# Patient Record
Sex: Female | Born: 1975 | Race: White | Hispanic: Yes | Marital: Single | Smoking: Never smoker
Health system: Southern US, Community
[De-identification: ages and names within clinical notes are randomized; demographics above are authoritative.]

---

## 1997-12-24 HISTORY — PX: TUBAL LIGATION: SHX77

## 2018-07-16 ENCOUNTER — Emergency Department: Payer: Self-pay

## 2018-07-16 ENCOUNTER — Emergency Department
Admission: EM | Admit: 2018-07-16 | Discharge: 2018-07-17 | Disposition: A | Discharge status: Home / Self Care | Payer: Self-pay | Attending: Emergency Medicine | Admitting: Emergency Medicine

## 2018-07-16 ENCOUNTER — Other Ambulatory Visit: Payer: Self-pay

## 2018-07-16 DIAGNOSIS — N920 Excessive and frequent menstruation with regular cycle: Secondary | ICD-10-CM

## 2018-07-16 DIAGNOSIS — D259 Leiomyoma of uterus, unspecified: Secondary | ICD-10-CM | POA: Insufficient documentation

## 2018-07-16 DIAGNOSIS — D219 Benign neoplasm of connective and other soft tissue, unspecified: Secondary | ICD-10-CM

## 2018-07-16 DIAGNOSIS — Z3202 Encounter for pregnancy test, result negative: Secondary | ICD-10-CM | POA: Insufficient documentation

## 2018-07-16 DIAGNOSIS — N939 Abnormal uterine and vaginal bleeding, unspecified: Secondary | ICD-10-CM | POA: Insufficient documentation

## 2018-07-16 LAB — COMPREHENSIVE METABOLIC PANEL
ALK PHOS: 58 U/L (ref 38–126)
ALT: 15 U/L (ref 0–44)
AST: 15 U/L (ref 15–41)
Albumin: 3.1 g/dL — ABNORMAL LOW (ref 3.5–5.0)
Anion gap: 4 — ABNORMAL LOW (ref 5–15)
BILIRUBIN TOTAL: 0.4 mg/dL (ref 0.3–1.2)
BUN: 18 mg/dL (ref 6–20)
CALCIUM: 8.5 mg/dL — AB (ref 8.9–10.3)
CO2: 27 mmol/L (ref 22–32)
CREATININE: 0.86 mg/dL (ref 0.44–1.00)
Chloride: 109 mmol/L (ref 98–111)
GFR calc non Af Amer: >60 mL/min (ref >60)
Glucose, Bld: 92 mg/dL (ref 70–99)
Potassium: 4.3 mmol/L (ref 3.5–5.1)
SODIUM: 140 mmol/L (ref 135–145)
TOTAL PROTEIN: 5.7 g/dL — AB (ref 6.5–8.1)

## 2018-07-16 LAB — URINALYSIS, COMPLETE (UACMP) WITH MICROSCOPIC
BACTERIA UA: NONE SEEN
Bilirubin Urine: NEGATIVE
GLUCOSE, UA: NEGATIVE mg/dL
Ketones, ur: NEGATIVE mg/dL
Nitrite: NEGATIVE
PH: 7 (ref 5.0–8.0)
Protein, ur: NEGATIVE mg/dL
SPECIFIC GRAVITY, URINE: 1.02 (ref 1.005–1.030)

## 2018-07-16 LAB — CBC
HEMATOCRIT: 43.2 % (ref 35.0–47.0)
HEMOGLOBIN: 14.6 g/dL (ref 12.0–16.0)
MCH: 28.1 pg (ref 26.0–34.0)
MCHC: 33.7 g/dL (ref 32.0–36.0)
MCV: 83.4 fL (ref 80.0–100.0)
Platelets: 174 10*3/uL (ref 150–440)
RBC: 5.18 MIL/uL (ref 3.80–5.20)
RDW: 15 % — ABNORMAL HIGH (ref 11.5–14.5)
WBC: 7.2 10*3/uL (ref 3.6–11.0)

## 2018-07-16 LAB — POCT PREGNANCY, URINE: Preg Test, Ur: NEGATIVE

## 2018-07-16 NOTE — ED Triage Notes (Signed)
Pt in with co vaginal bleeding and abd cramping for a month. States she is not pregnant, has not seen pmd for the same.

## 2018-07-17 NOTE — ED Notes (Signed)
Vaginal bleeding x 1 month.

## 2018-07-17 NOTE — ED Provider Notes (Signed)
[facility] Emergency Department Provider Note ___________________   First MD Initiated Contact with Patient 07/17/18 0032     (approximate)  I have reviewed the triage vital signs and the nursing notes.   HISTORY  Chief Complaint Vaginal Bleeding   HPI Colleen Meyer is a 42 y.o. female presents to the emergency department with vaginal bleeding x1 month.  Patient states that her menstrual cycles usually very regular to every 28 days however for the past month she has had bleeding ranging from spotting to heavy.  Patient does admit some pelvic discomfort none at present.  No personal family history of fibroids or uterine CA  Past medical history None There are no active problems to display for this patient.   Past surgical history None  Prior to Admission medications   Not on File    Allergies Patient has no known allergies.  No family history on file.  Social History Social History   Tobacco Use  . Smoking status: Not on file  Substance Use Topics  . Alcohol use: Not on file  . Drug use: Not on file    Review of Systems Constitutional: No fever/chills Eyes: No visual changes. ENT: No sore throat. Cardiovascular: Denies chest pain. Respiratory: Denies shortness of breath. Gastrointestinal: No abdominal pain.  No nausea, no vomiting.  No diarrhea.  No constipation. Genitourinary: Negative for dysuria.  Positive for vaginal bleeding  musculoskeletal: Negative for neck pain.  Negative for back pain. Integumentary: Negative for rash. Neurological: Negative for headaches, focal weakness or numbness.   ____________________________________________   PHYSICAL EXAM:  VITAL SIGNS: ED Triage Vitals  Enc Vitals Group     BP 07/16/18 2309 134/73     Pulse Rate 07/16/18 2309 72     Resp 07/16/18 2309 20     Temp 07/16/18 2309 98.7 F (37.1 C)     Temp Source 07/16/18 2309 Oral     SpO2 07/16/18 2309 100 %     Weight 07/16/18 2310  108.9 kg (240 lb)     Height 07/16/18 2310 1.626 m (5\' 4" )     Head Circumference --      Peak Flow --      Pain Score 07/16/18 2310 5     Pain Loc --      Pain Edu? --      Excl. in [place]? --     Constitutional: Alert and oriented. Well appearing and in no acute distress. Eyes: Conjunctivae are normal.  Head: Atraumatic. Mouth/Throat: Mucous membranes are moist.  Oropharynx non-erythematous. Neck: No stridor.   Cardiovascular: Normal rate, regular rhythm. Good peripheral circulation. Grossly normal heart sounds. Respiratory: Normal respiratory effort.  No retractions. Lungs CTAB. Gastrointestinal: Soft and nontender. No distention.  Musculoskeletal: No lower extremity tenderness nor edema. No gross deformities of extremities. Neurologic:  Normal speech and language. No gross focal neurologic deficits are appreciated.  Skin:  Skin is warm, dry and intact. No rash noted. Psychiatric: Mood and affect are normal. Speech and behavior are normal.  ____________________________________________   LABS (all labs ordered are listed, but only abnormal results are displayed)  Labs Reviewed  CBC - Abnormal; Notable for the following components:      Result Value   RDW 15.0 (*)    All other components within normal limits  COMPREHENSIVE METABOLIC PANEL - Abnormal; Notable for the following components:   Calcium 8.5 (*)    Total Protein 5.7 (*)    Albumin 3.1 (*)  Anion gap 4 (*)    All other components within normal limits  URINALYSIS, COMPLETE (UACMP) WITH MICROSCOPIC - Abnormal; Notable for the following components:   Color, Urine YELLOW (*)    APPearance HAZY (*)    Hgb urine dipstick LARGE (*)    Leukocytes, UA LARGE (*)    All other components within normal limits  POC URINE PREG, ED  POCT PREGNANCY, URINE    RADIOLOGY I, Keota N Lanijah Warzecha, personally viewed and evaluated these images (plain radiographs) as part of my medical decision making, as well as reviewing the written  report by the radiologist.  ED MD interpretation: Multiple uterine fibroids and thickened endometrium noted by the radiologist on ultrasound.  Official radiology report(s): US Pelvis Transvanginal Non-ob (tv Only)  Result Date: 07/17/2018 CLINICAL DATA:  Vaginal bleeding and lower abdominal cramping for months EXAM: TRANSABDOMINAL AND TRANSVAGINAL ULTRASOUND OF PELVIS TECHNIQUE: Both transabdominal and transvaginal ultrasound examinations of the pelvis were performed. Transabdominal technique was performed for global imaging of the pelvis including uterus, ovaries, adnexal regions, and pelvic cul-de-sac. It was necessary to proceed with endovaginal exam following the transabdominal exam to visualize the adnexa and ovaries. COMPARISON:  None FINDINGS: Uterus Measurements: 9.2 x 5.1 x 6.1 cm. Uterine fibroids. Submucosal posterior fibroid measuring 3 x 2 x 2.6 cm. Small anterior fibroid measuring 1.3 x 1.1 x 1.2 cm. Hypoechoic area indenting the endometrium, may reflect a portion of the submucosal fibroid. Endometrium Thickness: 8.4.  No focal abnormality visualized. Right ovary Measurements: 3 x 1.5 x 1.2 cm.  Normal appearance/no adnexal mass. Left ovary Not seen Other findings No abnormal free fluid. IMPRESSION: 1. Endometrial thickness of 8.4 mm. If bleeding remains unresponsive to hormonal or medical therapy, sonohysterogram should be considered for focal lesion work-up. (Ref: Radiological Reasoning: Algorithmic Workup of Abnormal Vaginal Bleeding with Endovaginal Sonography and Sonohysterography. AJR 2008; 532:D92-42) 2. Multiple uterine fibroids including 3 cm submucosal posterior wall fibroid with indentation or extension of fibroid to the endometrial stripe. 3. Nonvisualized left ovary Electronically Signed   By: Donavan Foil M.D.   On: 07/17/2018 01:14   US Pelvis Complete  Result Date: 07/17/2018 CLINICAL DATA:  Vaginal bleeding and lower abdominal cramping for months EXAM: TRANSABDOMINAL AND  TRANSVAGINAL ULTRASOUND OF PELVIS TECHNIQUE: Both transabdominal and transvaginal ultrasound examinations of the pelvis were performed. Transabdominal technique was performed for global imaging of the pelvis including uterus, ovaries, adnexal regions, and pelvic cul-de-sac. It was necessary to proceed with endovaginal exam following the transabdominal exam to visualize the adnexa and ovaries. COMPARISON:  None FINDINGS: Uterus Measurements: 9.2 x 5.1 x 6.1 cm. Uterine fibroids. Submucosal posterior fibroid measuring 3 x 2 x 2.6 cm. Small anterior fibroid measuring 1.3 x 1.1 x 1.2 cm. Hypoechoic area indenting the endometrium, may reflect a portion of the submucosal fibroid. Endometrium Thickness: 8.4.  No focal abnormality visualized. Right ovary Measurements: 3 x 1.5 x 1.2 cm.  Normal appearance/no adnexal mass. Left ovary Not seen Other findings No abnormal free fluid. IMPRESSION: 1. Endometrial thickness of 8.4 mm. If bleeding remains unresponsive to hormonal or medical therapy, sonohysterogram should be considered for focal lesion work-up. (Ref: Radiological Reasoning: Algorithmic Workup of Abnormal Vaginal Bleeding with Endovaginal Sonography and Sonohysterography. AJR 2008; 683:M19-62) 2. Multiple uterine fibroids including 3 cm submucosal posterior wall fibroid with indentation or extension of fibroid to the endometrial stripe. 3. Nonvisualized left ovary Electronically Signed   By: Donavan Foil M.D.   On: 07/17/2018 01:14    ____________________________________________  Procedures   ____________________________________________   INITIAL IMPRESSION / ASSESSMENT AND PLAN / ED COURSE  As part of my medical decision making, I reviewed the following data within the electronic MEDICAL RECORD NUMBER   42 year old female presenting with above-stated history and physical exam secondary to abnormal uterine bleeding.  Ultrasound revealed evidence of multiple uterine fibroids and a thickened endometrium.   Patient was referred to Dr. Nechama Guard OB/GYN for further outpatient evaluation and management. ____________________________________________  FINAL CLINICAL IMPRESSION(S) / ED DIAGNOSES  Final diagnoses:  Abnormal uterine bleeding (AUB)  Fibroids     MEDICATIONS GIVEN DURING THIS VISIT:  Medications - No data to display   ED Discharge Orders    None       Note:  This document was prepared using Dragon voice recognition software and may include unintentional dictation errors.    Gregor Hams, MD 07/17/18 [phone]

## 2021-03-31 ENCOUNTER — Encounter: Payer: Self-pay | Admitting: Family Medicine

## 2021-04-26 ENCOUNTER — Ambulatory Visit: Payer: Self-pay | Attending: Oncology | Admitting: *Deleted

## 2021-04-26 ENCOUNTER — Encounter: Payer: Self-pay | Admitting: *Deleted

## 2021-04-26 ENCOUNTER — Ambulatory Visit
Admission: RE | Admit: 2021-04-26 | Discharge: 2021-04-26 | Disposition: A | Discharge status: Home / Self Care | Payer: Self-pay | Source: Ambulatory Visit | Attending: Oncology | Admitting: Oncology

## 2021-04-26 ENCOUNTER — Other Ambulatory Visit: Payer: Self-pay

## 2021-04-26 VITALS — BP 125/75 | HR 66 | Temp 97.9°F | Ht 64.0 in | Wt 211.5 lb

## 2021-04-26 DIAGNOSIS — Z0000 Encounter for general adult medical examination without abnormal findings: Secondary | ICD-10-CM | POA: Insufficient documentation

## 2021-04-26 NOTE — Progress Notes (Signed)
  Subjective:     Patient ID: Colleen Meyer, female   DOB: 11-May-1976, 45 y.o.   MRN: [id number]  HPI   [facility] Medical History Record - 04/26/21 1016      Gynecological/Obstetrical History   Laser/Cryosurgery --   [facility] 20 years ago           Review of Systems     Objective:   Physical Exam Chest:  Breasts:     Right: No swelling, bleeding, inverted nipple, mass, nipple discharge, skin change, tenderness, axillary adenopathy or supraclavicular adenopathy.     Left: No swelling, bleeding, inverted nipple, mass, nipple discharge, skin change, tenderness, axillary adenopathy or supraclavicular adenopathy.     Lymphadenopathy:     Upper Body:     Right upper body: No supraclavicular or axillary adenopathy.     Left upper body: No supraclavicular or axillary adenopathy.        Assessment:     45 year old English speaking Hispanic female referred to [place] by the [facility] for further evaluation of recent abnormal pap of HPV positive LSIL.  Patient is schedule to see Dr. Kenton Kingfisher tomorrow for colposcopy and possible cervical biopsy. On clinical breast exam bilateral breast have a fibroglandular like tissue.  There is no dominant mass, skin changes, nipple discharge or lymphadenopathy.  Taught self breast awareness.    Patient has been screened for eligibility.  She does not have any insurance, Medicare or Medicaid.  She also meets financial eligibility.   Risk Assessment    Risk Scores      04/26/2021   Last edited by: Theodore Demark, RN   5-year risk: 0.4 %   Lifetime risk: 4.9 %          Plan:     Screening mammogram ordered.  Patient to keep appointment with Dr. Kenton Kingfisher tomorrow.  Will follow up per [facility] protocol.

## 2021-04-26 NOTE — Patient Instructions (Signed)
Gave patient hand-out, Women Staying Healthy, Active and Well from [facility], with education on breast health, pap smears, heart and colon health. 

## 2021-04-27 ENCOUNTER — Ambulatory Visit (INDEPENDENT_AMBULATORY_CARE_PROVIDER_SITE_OTHER): Payer: Self-pay | Admitting: Obstetrics & Gynecology

## 2021-04-27 ENCOUNTER — Ambulatory Visit
Admission: RE | Admit: 2021-04-27 | Discharge: 2021-04-27 | Disposition: A | Discharge status: Home / Self Care | Payer: Self-pay | Source: Ambulatory Visit | Attending: *Deleted | Admitting: *Deleted

## 2021-04-27 ENCOUNTER — Other Ambulatory Visit: Payer: Self-pay

## 2021-04-27 ENCOUNTER — Other Ambulatory Visit (HOSPITAL_COMMUNITY)
Admission: RE | Admit: 2021-04-27 | Discharge: 2021-04-27 | Disposition: A | Discharge status: Home / Self Care | Payer: Medicaid Other | Source: Ambulatory Visit | Attending: Obstetrics & Gynecology | Admitting: Obstetrics & Gynecology

## 2021-04-27 ENCOUNTER — Encounter: Payer: Self-pay | Admitting: Obstetrics & Gynecology

## 2021-04-27 ENCOUNTER — Other Ambulatory Visit: Payer: Self-pay | Admitting: *Deleted

## 2021-04-27 VITALS — BP 120/80 | Ht 64.0 in | Wt 195.0 lb

## 2021-04-27 DIAGNOSIS — N871 Moderate cervical dysplasia: Secondary | ICD-10-CM | POA: Diagnosis not present

## 2021-04-27 DIAGNOSIS — R87612 Low grade squamous intraepithelial lesion on cytologic smear of cervix (LGSIL): Secondary | ICD-10-CM | POA: Insufficient documentation

## 2021-04-27 DIAGNOSIS — Z1231 Encounter for screening mammogram for malignant neoplasm of breast: Secondary | ICD-10-CM

## 2021-04-27 DIAGNOSIS — N63 Unspecified lump in unspecified breast: Secondary | ICD-10-CM

## 2021-04-27 NOTE — Progress Notes (Signed)
Referring Provider:  [facility]  HPI:  Colleen Meyer is a 45 y.o.  [phone]  who presents today for evaluation and management of abnormal cervical cytology.    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  OB History  Gravida Para Term Preterm AB Living  3 3 3     3   SAB IAB Ectopic Multiple Live Births               # Outcome Date GA Lbr Len/2nd Weight Sex Delivery Anes PTL Lv  3 Term           2 Term           1 Term             History reviewed. No pertinent past medical history.  History reviewed. No pertinent surgical history.  SOCIAL HISTORY: Social History   Substance and Sexual Activity  Alcohol Use Yes   Social History   Substance and Sexual Activity  Drug Use Never     History reviewed. No pertinent family history.  ALLERGIES:  Patient has no known allergies.  No current outpatient medications on file prior to visit.   No current facility-administered medications on file prior to visit.    Physical Exam: -Vitals:  BP 120/80   Ht 5\' 4"  (1.626 m)   Wt 195 lb (88.5 kg)   LMP 04/01/2021   BMI 33.47 kg/m  GEN: WD, WN, NAD.  A+ O x 3, good mood and affect. ABD:  NT, ND.  Soft, no masses.  No hernias noted.   Pelvic:   Vulva: Normal appearance.  No lesions.  Vagina: No lesions or abnormalities noted.  Support: Normal pelvic support.  Urethra No masses tenderness or scarring.  Meatus Normal size without lesions or prolapse.  Cervix: See below.  Anus: Normal exam.  No lesions.  Perineum: Normal exam.  No lesions.        Bimanual   Uterus: Normal size.  Non-tender.  Mobile.  AV.  Adnexae: No masses.  Non-tender to palpation.  Cul-de-sac: Negative for abnormality.   PROCEDURE: 1.  Urine Pregnancy Test:  not done 2.  Colposcopy performed with 4% acetic acid after verbal consent obtained                                          -Aceto-white Lesions Location(s): none.              -Biopsy performed at 6, 12 o'clock               -ECC indicated and performed: Yes.       -Biopsy sites made hemostatic with pressure, AgNO3, and/or Monsel's solution   -Satisfactory colposcopy: Yes.      -Evidence of Invasive cervical CA :  NO  ASSESSMENT:  Colleen Meyer is a 45 y.o. [phone] here for  1. LGSIL on Pap smear of cervix   .  PLAN: 1.  I discussed the grading system of pap smears and HPV high risk viral types.  We will discuss and base management after colpo results return. 2. Follow up PAP 6 months, vs intervention if high grade dysplasia identified 3. Treatment of persistantly abnormal PAP smears and cervical dysplasia, even mild, is discussed  w pt today in detail, as well as the pros and cons of Cryo and LEEP procedures. Will consider and discuss after results.      Barnett Applebaum, MD, [facility], [facility] 04/27/2021  3:22 PM

## 2021-04-27 NOTE — Patient Instructions (Signed)
[email or web address]  Colposcopy, Care After This sheet gives you information about how to care for yourself after your procedure. Your health care provider may also give you more specific instructions. If you have problems or questions, contact your health care provider. What can I expect after the procedure? If you had a colposcopy without a biopsy, you can expect to feel fine right away after your procedure. However, you may have some spotting of blood for a few days. You can return to your normal activities. If you had a colposcopy with a biopsy, it is common after the procedure to have:  Soreness and mild pain. These may last for a few days.  Light-headedness.  Mild vaginal bleeding or discharge that is dark-colored and grainy. This may last for a few days. The discharge may be caused by a liquid (solution) that was used during the procedure. You may need to wear a sanitary pad during this time.  Spotting of blood for at least 48 hours after the procedure. Follow these instructions at home: Medicines  Take over-the-counter and prescription medicines only as told by your health care provider.  Talk with your health care provider about what type of over-the-counter pain medicine and prescription medicine you can start to take again. It is especially important to talk with your health care provider if you take blood thinners. Activity  Limit your physical activity for the first day after your procedure as told by your health care provider.  Avoid using douche products, using tampons, or having sex for at least 3 days after the procedure or for as long as told.  Return to your normal activities as told by your health care provider. Ask your health care provider what activities are safe for you. General instructions  Drink enough fluid to keep your urine pale yellow.  Ask your health care provider if you may take baths, swim, or use a hot tub. You may take  showers.  If you use birth control (contraception), continue to use it.  Keep all follow-up visits as told by your health care provider. This is important.   Contact a health care provider if:  You develop a skin rash. Get help right away if:  You bleed a lot from your vagina or pass blood clots. This includes using more than one sanitary pad each hour for 2 hours in a row.  You have a fever or chills.  You have vaginal discharge that is abnormal, is yellow in color, or smells bad. This could be a sign of infection.  You have severe pain or cramps in your lower abdomen that do not go away with medicine.  You faint. Summary  If you had a colposcopy without a biopsy, you can expect to feel fine right away, but you may have some spotting of blood for a few days. You can return to your normal activities.  If you had a colposcopy with a biopsy, it is common to have mild pain for a few days and spotting for 48 hours after the procedure.  Avoid using douche products, using tampons, and having sex for at least 3 days after the procedure or for as long as told by your health care provider.  Get help right away if you have heavy bleeding, severe pain, or signs of infection. This information is not intended to replace advice given to you by your health care provider. Make sure you discuss any questions you have with your health care provider. Document Revised: 12/09/2019 Document Reviewed:   12/09/2019 Elsevier Patient Education  2021 Elsevier Inc.  

## 2021-05-01 LAB — SURGICAL PATHOLOGY

## 2021-05-01 NOTE — Progress Notes (Signed)
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

## 2021-05-02 ENCOUNTER — Telehealth: Payer: Self-pay

## 2021-05-02 NOTE — Telephone Encounter (Signed)
Patient is scheduled for 05/16/21 with [facility]. I gave patient phone number for Ann with [facility] at [place] [facility] to follow up about coverage

## 2021-05-02 NOTE — Telephone Encounter (Signed)
Called and left voicemail for patient to call back to be scheduled. 

## 2021-05-02 NOTE — Telephone Encounter (Signed)
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

## 2021-05-05 ENCOUNTER — Ambulatory Visit
Admission: RE | Admit: 2021-05-05 | Discharge: 2021-05-05 | Disposition: A | Discharge status: Home / Self Care | Payer: Self-pay | Source: Ambulatory Visit | Attending: Oncology | Admitting: Oncology

## 2021-05-05 ENCOUNTER — Other Ambulatory Visit: Payer: Self-pay

## 2021-05-05 DIAGNOSIS — N63 Unspecified lump in unspecified breast: Secondary | ICD-10-CM | POA: Insufficient documentation

## 2021-05-09 NOTE — Progress Notes (Signed)
Dr. Kenton Kingfisher, she is coming this afternoon to sign [facility] Medicaid application.  It usually takes about 2 weeks for approval.

## 2021-05-11 ENCOUNTER — Other Ambulatory Visit: Payer: Self-pay | Admitting: *Deleted

## 2021-05-11 DIAGNOSIS — N6489 Other specified disorders of breast: Secondary | ICD-10-CM

## 2021-05-15 ENCOUNTER — Ambulatory Visit: Payer: Self-pay | Admitting: Obstetrics & Gynecology

## 2021-05-23 NOTE — Progress Notes (Signed)
Copy to [facility].  Dr.  Kenton Kingfisher following patient. LEEP scheduled.  B[facility]M approved.

## 2021-05-31 ENCOUNTER — Other Ambulatory Visit: Payer: Self-pay

## 2021-05-31 ENCOUNTER — Ambulatory Visit (INDEPENDENT_AMBULATORY_CARE_PROVIDER_SITE_OTHER): Payer: Medicaid Other | Admitting: Obstetrics & Gynecology

## 2021-05-31 ENCOUNTER — Encounter: Payer: Self-pay | Admitting: Obstetrics & Gynecology

## 2021-05-31 DIAGNOSIS — N871 Moderate cervical dysplasia: Secondary | ICD-10-CM | POA: Diagnosis not present

## 2021-05-31 NOTE — Progress Notes (Signed)
   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  The indications for cryotherapy were reviewed with the patient in detail. She was counseled about that efficacy of this procedure, and possible need for excisional procedure in the future if her cervical dysplasia persists.  The risks of the procedure where explained in detail and patient was told to expect a copious amount of discharge in the next few weeks. All her questions were answered, and written informed consent was obtained.  The patient was placed in the dorsal lithotomy position and a vaginal speculum was placed. Her cervix was visualized and patient was noted to have had normal size transformation zone. The appropriate cryotherapy probes were picked and the more endocervical probe was then affixed to cryotherapy apparatus. Then, nitrogen gas was then activated, the probe was applied to the transformation zone of the cervix. This was kept in place for 2 minutes. The cryotherapy was then stopped and all instruments were removed from the patient's pelvis; a thawing period of 2 minutes was observed.  A second cycle of cryotherapy was then administered to the cervix with the more ectocervical probe for 2 minutes.  Then, the probe was removed.  The patient tolerated the procedure well without any complications. Routine post procedure instructions were given to the patient.  Will repeat pap smear in 6 months and manage accordingly.  Barnett Applebaum, MD, [facility], [facility] 05/31/2021  3:44 PM

## 2021-06-22 ENCOUNTER — Ambulatory Visit: Payer: Medicaid Other | Admitting: Podiatry

## 2021-06-27 ENCOUNTER — Other Ambulatory Visit: Payer: Self-pay

## 2021-06-27 ENCOUNTER — Ambulatory Visit (INDEPENDENT_AMBULATORY_CARE_PROVIDER_SITE_OTHER): Payer: Medicaid Other | Admitting: Podiatry

## 2021-06-27 DIAGNOSIS — B351 Tinea unguium: Secondary | ICD-10-CM | POA: Diagnosis not present

## 2021-06-27 DIAGNOSIS — Z79899 Other long term (current) drug therapy: Secondary | ICD-10-CM

## 2021-06-27 NOTE — Progress Notes (Signed)
epa

## 2021-06-28 ENCOUNTER — Encounter: Payer: Self-pay | Admitting: Podiatry

## 2021-06-28 NOTE — Progress Notes (Signed)
  Subjective:  Patient ID: Colleen Meyer, female    DOB: 11-Aug-1976,  MRN: [id number]  No chief complaint on file.   45 y.o. female presents with the above complaint.  Patient presents with complaint of bilateral hallux thickened elongated dystrophic mycotic toenails x2.  Patient states they are not painful to touch.  She would like to get it evaluated and get it treated.  She has not seen an respiratory see me.  She denies trying Lamisil therapy.  She denies any other acute complaints.   Review of Systems: Negative except as noted in the HPI. Denies N/V/F/Ch.  No past medical history on file. No current outpatient medications on file.  Social History   Tobacco Use  Smoking Status Never  Smokeless Tobacco Never    No Known Allergies Objective:  There were no vitals filed for this visit. There is no height or weight on file to calculate BMI. Constitutional Well developed. Well nourished.  Vascular Dorsalis pedis pulses palpable bilaterally. Posterior tibial pulses palpable bilaterally. Capillary refill normal to all digits.  No cyanosis or clubbing noted. Pedal hair growth normal.  Neurologic Normal speech. Oriented to person, place, and time. Epicritic sensation to light touch grossly present bilaterally.  Dermatologic Nails bilateral hallux thickened elongated dystrophic mycotic nails x2.  Mild pain on palpation. Skin within normal limits  Orthopedic: Normal joint ROM without pain or crepitus bilaterally. No visible deformities. No bony tenderness.   Radiographs: None Assessment:   1. Long-term use of high-risk medication   2. Nail fungus   3. Onychomycosis due to dermatophyte    Plan:  Patient was evaluated and treated and all questions answered.  Bilateral hallux onychomycosis -Educated the patient on the etiology of onychomycosis and various treatment options associated with improving the fungal load.  I explained to the patient that there is 3 treatment options  available to treat the onychomycosis including topical, p.o., laser treatment.  Patient elected to undergo p.o. options with Lamisil/terbinafine therapy.  In order for me to start the medication therapy, I explained to the patient the importance of evaluating the liver and obtaining the liver function test.  Once the liver function test comes back normal I will start him on 90-month course of Lamisil therapy.  Patient understood all risk and would like to proceed with Lamisil therapy.  I have asked the patient to immediately stop the Lamisil therapy if she has any reactions to it and call the office or go to the emergency room right away.  Patient states understanding    No follow-ups on file.

## 2021-06-30 LAB — HEPATIC FUNCTION PANEL
ALT: 9 IU/L (ref 0–32)
AST: 15 IU/L (ref 0–40)
Albumin: 4 g/dL (ref 3.8–4.8)
Alkaline Phosphatase: 75 IU/L (ref 44–121)
Bilirubin Total: 0.2 mg/dL (ref 0.0–1.2)
Bilirubin, Direct: <0.1 mg/dL (ref 0.00–0.40)
Total Protein: 6.7 g/dL (ref 6.0–8.5)

## 2021-06-30 MED ORDER — TERBINAFINE HCL 250 MG PO TABS: 250.0000 mg | ORAL_TABLET | Freq: Every day | ORAL | 0 refills | Status: DC

## 2021-06-30 NOTE — Addendum Note (Signed)
Addended by: Boneta Lucks on: 06/30/2021 07:52 AM   Modules accepted: Orders

## 2021-09-04 ENCOUNTER — Encounter: Payer: Self-pay | Admitting: Obstetrics & Gynecology

## 2021-09-04 ENCOUNTER — Other Ambulatory Visit: Payer: Self-pay

## 2021-09-04 ENCOUNTER — Other Ambulatory Visit (HOSPITAL_COMMUNITY)
Admission: RE | Admit: 2021-09-04 | Discharge: 2021-09-04 | Disposition: A | Discharge status: Home / Self Care | Payer: Medicaid Other | Source: Ambulatory Visit | Attending: Obstetrics & Gynecology | Admitting: Obstetrics & Gynecology

## 2021-09-04 ENCOUNTER — Ambulatory Visit (INDEPENDENT_AMBULATORY_CARE_PROVIDER_SITE_OTHER): Payer: Medicaid Other | Admitting: Obstetrics & Gynecology

## 2021-09-04 VITALS — BP 120/80 | Ht 64.0 in | Wt 207.0 lb

## 2021-09-04 DIAGNOSIS — N871 Moderate cervical dysplasia: Secondary | ICD-10-CM | POA: Diagnosis not present

## 2021-09-04 NOTE — Progress Notes (Signed)
  HPI:  Patient is a 45 y.o. [id number] presenting for follow up evaluation of abnormal PAP smear in the past.  Her last PAP was 6 months ago and was abnormal: LGSIL . She has had a prior colposcopy. 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  She then had Cryo tp cervix 05/2021.  PMHx: She  has no past medical history on file. Also,  has no past surgical history on file., family history is not on file.,  reports that she has never smoked. She has never used smokeless tobacco. She reports current alcohol use. She reports that she does not use drugs.  She has a current medication list which includes the following prescription(s): terbinafine. Also, has No Known Allergies.  Review of Systems  All other systems reviewed and are negative.  Objective: BP 120/80   Ht '5\' 4"'$  (1.626 m)   Wt 207 lb (93.9 kg)   LMP 08/20/2021   BMI 35.53 kg/m  Filed Weights   09/04/21 1544  Weight: 207 lb (93.9 kg)   Body mass index is 35.53 kg/m.  Physical examination Physical Exam Constitutional:      General: She is not in acute distress.    Appearance: She is well-developed.  Genitourinary:     Right Labia: No rash or tenderness.    Left Labia: No tenderness or rash.    No vaginal erythema or bleeding.      Right Adnexa: not tender and no mass present.    Left Adnexa: not tender and no mass present.    No cervical motion tenderness, discharge, polyp or nabothian cyst.     Uterus is not enlarged.     No uterine mass detected.    Pelvic exam was performed with patient in the lithotomy position.  HENT:     Head: Normocephalic and atraumatic.     Nose: Nose normal.  Abdominal:     General: There is no distension.     Palpations: Abdomen is soft.     Tenderness: There is no abdominal tenderness.  Musculoskeletal:        General: Normal range of motion.  Neurological:     Mental Status: She is alert and oriented to person, place, and time.     Cranial Nerves: No cranial nerve deficit.  Skin:     General: Skin is warm and dry.  Psychiatric:        Attention and Perception: Attention normal.        Mood and Affect: Mood and affect normal.        Speech: Speech normal.        Behavior: Behavior normal.        Thought Content: Thought content normal.        Judgment: Judgment normal.    ASSESSMENT:  History of Cervical Dysplasia Prior CIN II, s/p Cryo  Plan:  1.  I discussed the grading system of pap smears and HPV high risk viral types.   2. Follow up PAP 6 months, vs intervention if high grade dysplasia identified.  A total of 20 minutes were spent face-to-face with the patient as well as preparation, review, communication, and documentation during this encounter.    Barnett Applebaum, MD, [facility], [facility] 09/04/2021  3:53 PM

## 2021-09-08 LAB — CYTOLOGY - PAP

## 2021-10-31 ENCOUNTER — Ambulatory Visit: Payer: Self-pay | Admitting: Obstetrics & Gynecology

## 2021-10-31 ENCOUNTER — Ambulatory Visit: Payer: Medicaid Other | Admitting: Podiatry

## 2021-11-07 ENCOUNTER — Ambulatory Visit
Admission: RE | Admit: 2021-11-07 | Discharge: 2021-11-07 | Disposition: A | Discharge status: Home / Self Care | Payer: Medicaid Other | Source: Ambulatory Visit | Attending: Oncology | Admitting: Oncology

## 2021-11-07 ENCOUNTER — Other Ambulatory Visit: Payer: Self-pay

## 2021-11-07 DIAGNOSIS — N6489 Other specified disorders of breast: Secondary | ICD-10-CM

## 2022-01-05 ENCOUNTER — Telehealth: Payer: Self-pay

## 2022-01-05 NOTE — Telephone Encounter (Signed)
Per [facility] Return in about 6 months (around 03/04/2022) for Follow up. I contacted patient via phone. Left vociemail for patient to call back to confirm scheduled appointment for Monday, 03/05/22 at 3:55 pm with [facility]

## 2022-03-05 ENCOUNTER — Other Ambulatory Visit (HOSPITAL_COMMUNITY)
Admission: RE | Admit: 2022-03-05 | Discharge: 2022-03-05 | Disposition: A | Discharge status: Home / Self Care | Payer: Medicaid Other | Source: Ambulatory Visit | Attending: Obstetrics & Gynecology | Admitting: Obstetrics & Gynecology

## 2022-03-05 ENCOUNTER — Ambulatory Visit (INDEPENDENT_AMBULATORY_CARE_PROVIDER_SITE_OTHER): Payer: Medicaid Other | Admitting: Obstetrics & Gynecology

## 2022-03-05 ENCOUNTER — Encounter: Payer: Self-pay | Admitting: Obstetrics & Gynecology

## 2022-03-05 ENCOUNTER — Ambulatory Visit: Payer: Medicaid Other | Admitting: Obstetrics & Gynecology

## 2022-03-05 ENCOUNTER — Other Ambulatory Visit: Payer: Self-pay

## 2022-03-05 VITALS — BP 100/70 | Ht 64.0 in | Wt 202.0 lb

## 2022-03-05 DIAGNOSIS — N871 Moderate cervical dysplasia: Secondary | ICD-10-CM

## 2022-03-05 NOTE — Progress Notes (Signed)
?  HPI:  Patient is a 46 y.o. [id number] presenting for follow up evaluation of abnormal PAP smear in the past.  Her last PAP was 6 months ago and was abnormal: LGSIL . She has had a prior colposcopy. 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  She had CRYO to cervix 9 mos ago. ? ?PMHx: ?She  has no past medical history on file. Also,  has no past surgical history on file., family history is not on file.,  reports that she has never smoked. She has never used smokeless tobacco. She reports current alcohol use. She reports that she does not use drugs. ? ?She currently has no medications in their medication list. Also, has No Known Allergies. ? ?Review of Systems  ?All other systems reviewed and are negative. ? ?Objective: ?BP 100/70   Ht '5\' 4"'$  (1.626 m)   Wt 202 lb (91.6 kg)   LMP 02/27/2022   BMI 34.67 kg/m?  ?Filed Weights  ? 03/05/22 1504  ?Weight: 202 lb (91.6 kg)  ? ?Body mass index is 34.67 kg/m?. ? ?Physical examination ?Physical Exam ?Constitutional:   ?   General: She is not in acute distress. ?   Appearance: She is well-developed.  ?Genitourinary:  ?   Right Labia: No rash or tenderness. ?   Left Labia: No tenderness or rash. ?   No vaginal erythema or bleeding.  ? ?   Right Adnexa: not tender and no mass present. ?   Left Adnexa: not tender and no mass present. ?   No cervical motion tenderness, discharge, polyp or nabothian cyst.  ?   Uterus is not enlarged.  ?   No uterine mass detected. ?   Pelvic exam was performed with patient in the lithotomy position.  ?HENT:  ?   Head: Normocephalic and atraumatic.  ?   Nose: Nose normal.  ?Abdominal:  ?   General: There is no distension.  ?   Palpations: Abdomen is soft.  ?   Tenderness: There is no abdominal tenderness.  ?Musculoskeletal:     ?   General: Normal range of motion.  ?Neurological:  ?   Mental Status: She is alert and oriented to person, place, and time.  ?   Cranial Nerves: No cranial nerve deficit.  ?Skin: ?   General: Skin is warm and dry.   ?Psychiatric:     ?   Attention and Perception: Attention normal.     ?   Mood and Affect: Mood and affect normal.     ?   Speech: Speech normal.     ?   Behavior: Behavior normal.     ?   Thought Content: Thought content normal.     ?   Judgment: Judgment normal.  ? ? ?ASSESSMENT:  History of Cervical Dysplasia ? ?Plan: ? ?1.  I discussed the grading system of pap smears and HPV high risk viral types.   ?2. Follow up PAP 6 months, vs intervention if high grade dysplasia identified. ?3. Treatment of persistantly abnormal PAP smears and cervical dysplasia, even mild, is discussed w pt today in detail, as well as the pros and cons of Cryo and LEEP procedures. Will consider and discuss after results. ? ?A total of 21 minutes were spent face-to-face with the patient as well as preparation, review, communication, and documentation during this encounter.  ? ? ?Barnett Applebaum, MD, FACOG ?[facility], [place] ?03/05/2022  3:50 PM ? ?

## 2022-03-08 LAB — CYTOLOGY - PAP

## 2022-12-27 ENCOUNTER — Other Ambulatory Visit (HOSPITAL_COMMUNITY)
Admission: RE | Admit: 2022-12-27 | Discharge: 2022-12-27 | Disposition: A | Discharge status: Home / Self Care | Payer: Medicaid Other | Source: Ambulatory Visit | Attending: Obstetrics & Gynecology | Admitting: Obstetrics & Gynecology

## 2022-12-27 ENCOUNTER — Encounter: Payer: Self-pay | Admitting: Obstetrics & Gynecology

## 2022-12-27 ENCOUNTER — Ambulatory Visit (INDEPENDENT_AMBULATORY_CARE_PROVIDER_SITE_OTHER): Payer: Medicaid Other | Admitting: Obstetrics & Gynecology

## 2022-12-27 VITALS — BP 100/60 | Ht 64.0 in | Wt 194.0 lb

## 2022-12-27 DIAGNOSIS — N898 Other specified noninflammatory disorders of vagina: Secondary | ICD-10-CM

## 2022-12-27 DIAGNOSIS — Z1231 Encounter for screening mammogram for malignant neoplasm of breast: Secondary | ICD-10-CM

## 2022-12-27 DIAGNOSIS — R87612 Low grade squamous intraepithelial lesion on cytologic smear of cervix (LGSIL): Secondary | ICD-10-CM | POA: Insufficient documentation

## 2022-12-27 NOTE — Progress Notes (Addendum)
   Established Patient Office Visit  Subjective   Patient ID: Colleen Meyer, female    DOB: [birth year]  Age: 47 y.o. MRN: [id number]  Chief Complaint  Patient presents with   Abnormal Pap Smear    HPI    47 yo monogamous P3 here for a follow up pap smear. She has a h/o abnormal paps- had a "laser" treatment to her cervix in around 1999 and a cryo last year. She had a LGSIL pap about 6 months ago.  She uses BTL for contraception. Objective:     BP 100/60   Ht '5\' 4"'$  (1.626 m)   Wt 194 lb (88 kg)   LMP 11/29/2022   BMI 33.30 kg/m    Physical Exam   Well nourished, well hydrated Latina, no apparent distress   Normal female without lesion or tenderness Spec exam reveals a frothy vaginal discharge tinged with blood. Her os is slightly stenotic The ectocervix is friable Moderate uterine prolapse, patulous cervix  Assessment & Plan:   Problem List Items Addressed This Visit   None Visit Diagnoses     LGSIL on Pap smear of cervix    -  Primary   Relevant Orders   Cytology - PAP   Vaginal discharge       Relevant Orders   Cervicovaginal ancillary only       She will need a colpo if this pap is not normal. She is aware of this.  Mammogram ordered   Emily Filbert, MD

## 2022-12-27 NOTE — Addendum Note (Signed)
Addended by: Emily Filbert on: 12/27/2022 10:16 AM   Modules accepted: Orders

## 2022-12-28 ENCOUNTER — Other Ambulatory Visit: Payer: Self-pay | Admitting: Obstetrics & Gynecology

## 2022-12-28 ENCOUNTER — Encounter: Payer: Self-pay | Admitting: Obstetrics & Gynecology

## 2022-12-28 LAB — CERVICOVAGINAL ANCILLARY ONLY
Bacterial Vaginitis (gardnerella): POSITIVE — AB
Chlamydia: NEGATIVE
Comment: NEGATIVE
Comment: NEGATIVE
Comment: NEGATIVE
Comment: NORMAL
Neisseria Gonorrhea: NEGATIVE
Trichomonas: NEGATIVE

## 2022-12-28 MED ORDER — METRONIDAZOLE 500 MG PO TABS: 500.0000 mg | ORAL_TABLET | Freq: Two times a day (BID) | ORAL | 0 refills | Status: DC

## 2022-12-28 NOTE — Progress Notes (Signed)
Flagyl prescribed for bv seen on [facility]. Message sent to patient.

## 2022-12-31 LAB — CYTOLOGY - PAP
Comment: NEGATIVE
Diagnosis: UNDETERMINED — AB
High risk HPV: POSITIVE — AB

## 2023-01-02 ENCOUNTER — Encounter: Payer: Self-pay | Admitting: Obstetrics & Gynecology

## 2023-02-17 IMAGING — MG MM DIGITAL DIAGNOSTIC UNILAT*L* W/ TOMO W/ CAD
6 of 12 series · 6 of 36 positions shown · non-contrast
Comparison: Previous exam(s).

CLINICAL DATA: The patient was called back for a left breast focal
asymmetry.

EXAM:
DIGITAL DIAGNOSTIC UNILATERAL LEFT MAMMOGRAM WITH TOMOSYNTHESIS AND
CAD; ULTRASOUND LEFT BREAST LIMITED
TECHNIQUE: Left digital diagnostic mammography and breast tomosynthesis was
performed. The images were evaluated with computer-aided detection.;
Targeted ultrasound examination of the left breast was performed

[L CC synth-2D (1 of 3)]
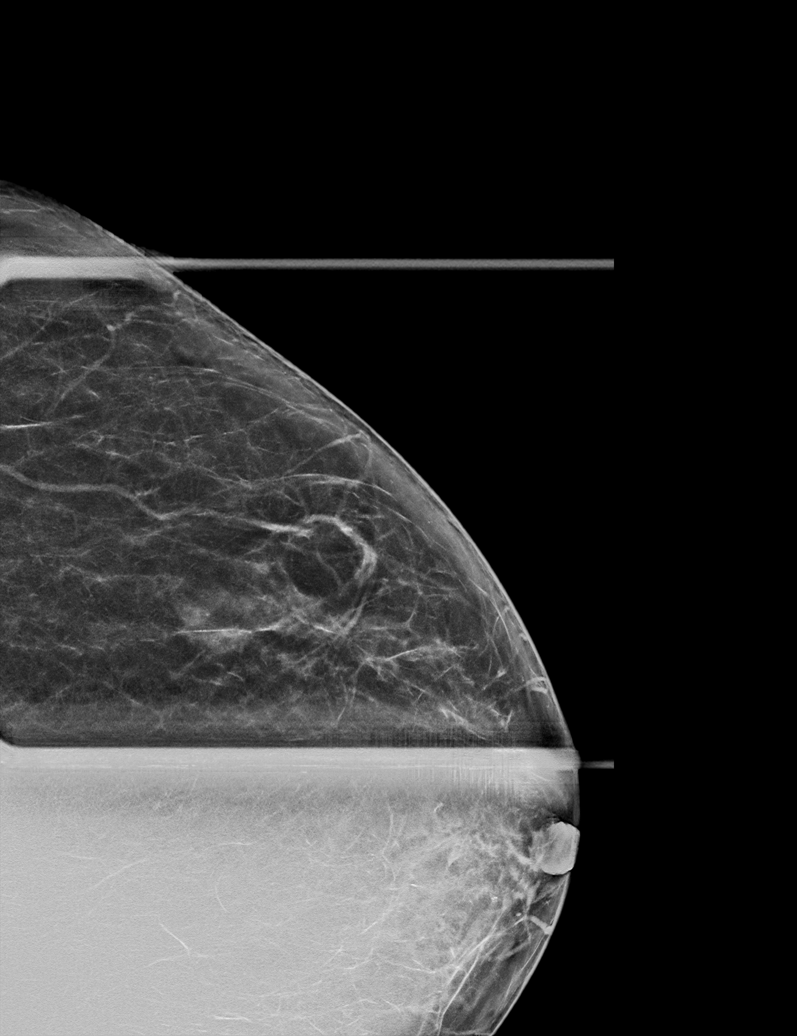

[L MLO synth-2D (1 of 3)]
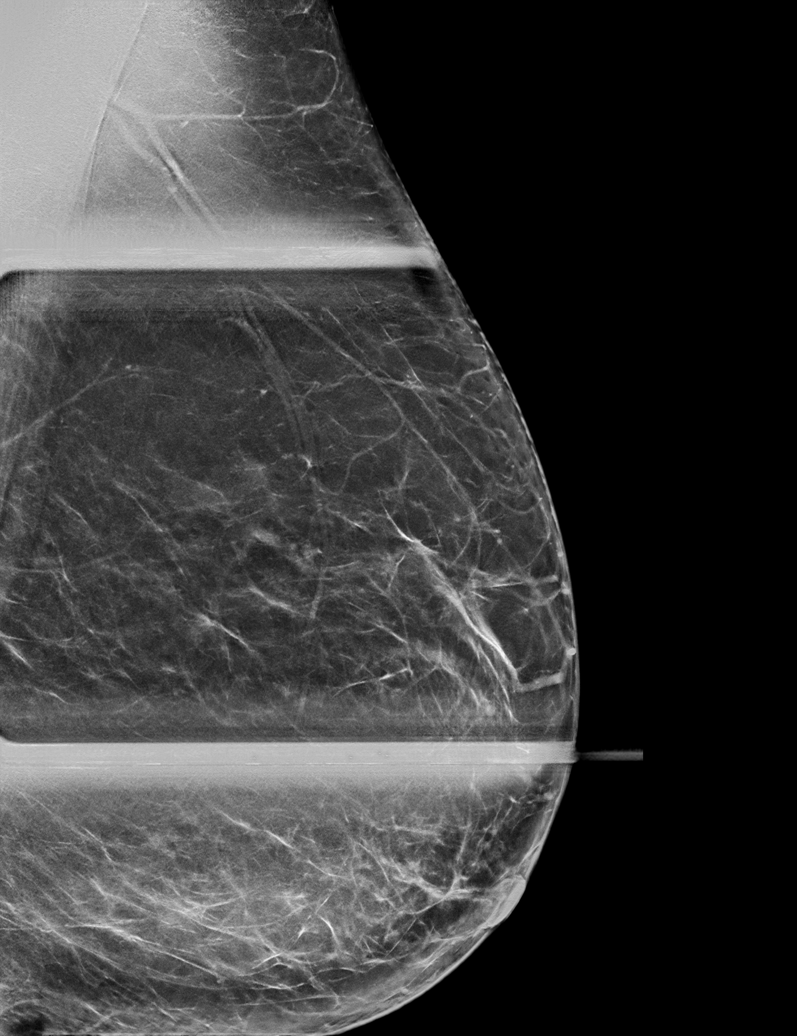

[L CC synth-2D (2 of 3)]
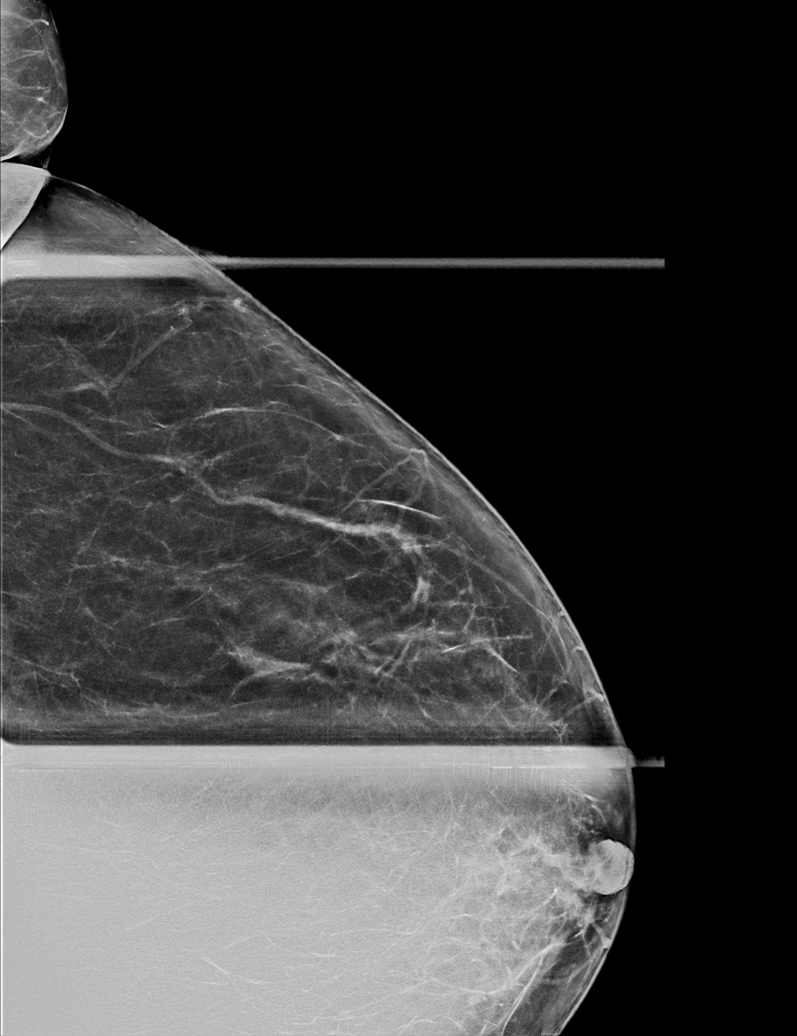

[L CC synth-2D (3 of 3)]
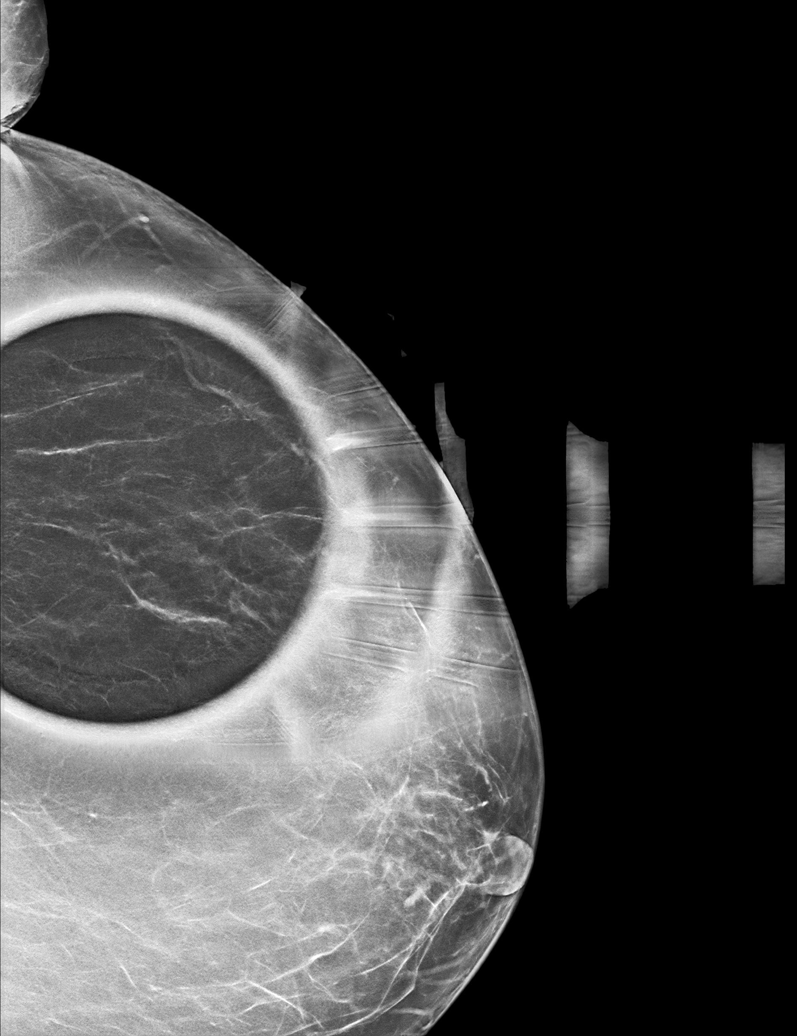

[L MLO synth-2D (2 of 3)]
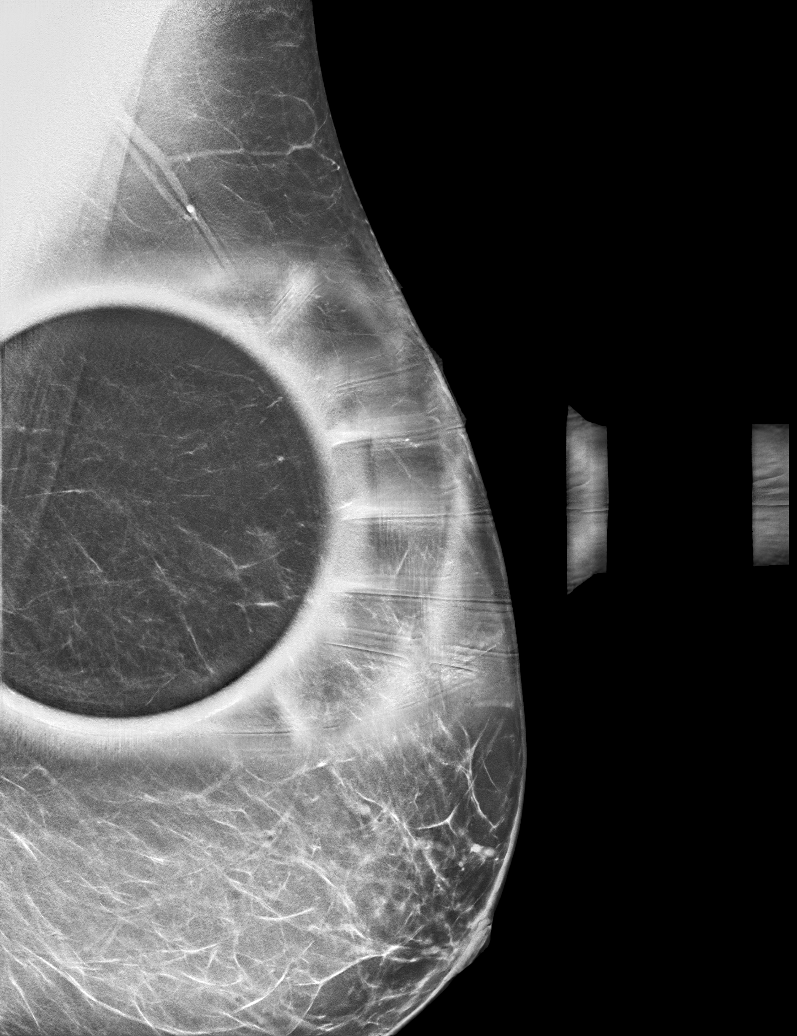

[L MLO synth-2D (3 of 3)]
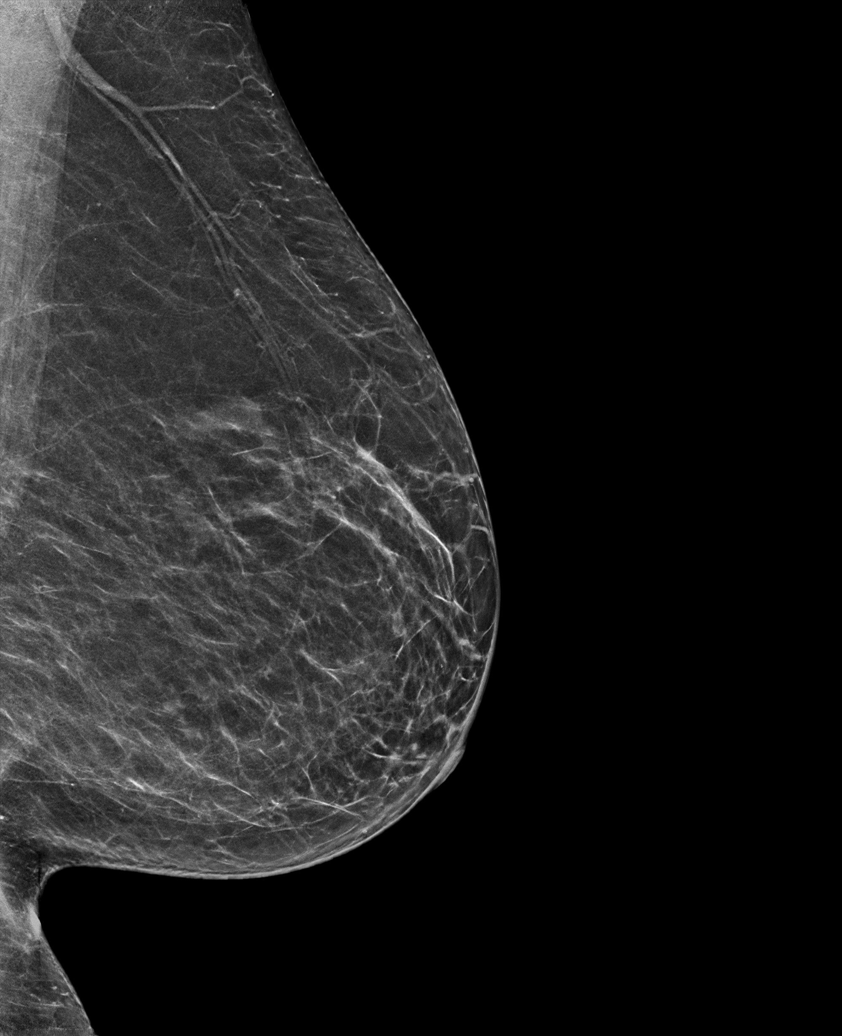

[6 of 36 positions shown; findings below may reference images not displayed]

ACR Breast Density Category b: There are scattered areas of
fibroglandular density.
FINDINGS: The left breast focal asymmetry persists but does improved on
today's imaging.

On physical exam, no suspicious lumps are identified.

Targeted ultrasound is performed, showing no sonographic correlate
for the left breast focal asymmetry located laterally and
superiorly.
IMPRESSION: The focal asymmetry is thought to be an island of tissue, more
conspicuous due to previous studies due to difference in technique.
The finding is probably benign.

RECOMMENDATION:
Recommend six-month follow-up mammography of the left breast focal
asymmetry.

I have discussed the findings and recommendations with the patient.
If applicable, a reminder letter will be sent to the patient
regarding the next appointment.

BI-RADS CATEGORY  3: Probably benign.

## 2023-09-15 ENCOUNTER — Encounter: Payer: Self-pay | Admitting: Emergency Medicine

## 2023-09-15 ENCOUNTER — Emergency Department
Admission: EM | Admit: 2023-09-15 | Discharge: 2023-09-15 | Disposition: A | Discharge status: Home / Self Care | Payer: Medicaid Other | Attending: Emergency Medicine | Admitting: Emergency Medicine

## 2023-09-15 ENCOUNTER — Other Ambulatory Visit: Payer: Self-pay

## 2023-09-15 DIAGNOSIS — T7840XA Allergy, unspecified, initial encounter: Secondary | ICD-10-CM | POA: Diagnosis present

## 2023-09-15 DIAGNOSIS — L259 Unspecified contact dermatitis, unspecified cause: Secondary | ICD-10-CM | POA: Diagnosis not present

## 2023-09-15 MED ORDER — PREDNISONE 10 MG (21) PO TBPK: ORAL_TABLET | ORAL | 0 refills | Status: DC

## 2023-09-15 MED ORDER — DEXAMETHASONE SODIUM PHOSPHATE 10 MG/ML IJ SOLN
10.0000 mg | Freq: Once | INTRAMUSCULAR | Status: AC
  Administered 2023-09-15: 10 mg via INTRAMUSCULAR
  Filled 2023-09-15: qty 1

## 2023-09-15 NOTE — Discharge Instructions (Signed)
Take medication as prescribed.  Follow-up with your regular doctor if not improving in 4 to 5 days.  She may need to run blood work for lupus

## 2023-09-15 NOTE — ED Triage Notes (Signed)
Pt in via POV, reports allergic reaction to unknown substance since last night.  States after dinner noticing a red area to left face, reports taking an allergy pill but without any relief.  One large hive noted to left cheek, denies any other hives.  Does report having pain down into left jaw.   Denies any new foods/soaps/etc.  Ambulatory to triage, NAD noted at this time.

## 2023-09-15 NOTE — ED Notes (Signed)
Pt d/c home per MD order. Discharge summary reviewed, pt verbalizes understanding. Ambulatory off unit. No s/s of acute distress noted. Discharged home with visitor.

## 2023-09-15 NOTE — ED Provider Notes (Signed)
[facility] Provider Note    Event Date/Time   First MD Initiated Contact with Patient 09/15/23 1942     (approximate)   History   Allergic Reaction   HPI  Colleen Meyer is a 47 y.o. female with no significant past medical history presents emergency department with redness to the left side of the face.  Patient states started last night.  Felt warm and hot like a hive.  No other hives.  Took an allergy pill without any relief.  States the area has gotten a little larger today.  No fever or chills.  No new foods, products, etc.      Physical Exam   Triage Vital Signs: ED Triage Vitals  Encounter Vitals Group     BP 09/15/23 1932 131/78     Systolic BP Percentile --      Diastolic BP Percentile --      Pulse Rate 09/15/23 1932 95     Resp 09/15/23 1932 18     Temp 09/15/23 1932 99.3 F (37.4 C)     Temp src --      SpO2 09/15/23 1932 100 %     Weight 09/15/23 1932 182 lb (82.6 kg)     Height 09/15/23 1932 5\' 4"  (1.626 m)     Head Circumference --      Peak Flow --      Pain Score 09/15/23 1932 6     Pain Loc --      Pain Education --      Exclude from Growth Chart --     Most recent vital signs: Vitals:   09/15/23 1932  BP: 131/78  Pulse: 95  Resp: 18  Temp: 99.3 F (37.4 C)  SpO2: 100%     General: Awake, no distress.   CV:  Good peripheral perfusion. regular rate and  rhythm Resp:  Normal effort. Lungs CTA Abd:  No distention.   Other:  Skin with a reddened raised area on the left side of the face, no drainage or pustules, neck is supple, no lymphadenopathy   ED Results / Procedures / Treatments   Labs (all labs ordered are listed, but only abnormal results are displayed) Labs Reviewed - No data to display   EKG     RADIOLOGY     PROCEDURES:   Procedures   MEDICATIONS ORDERED IN ED: Medications  dexamethasone (DECADRON) injection 10 mg (has no administration in time range)     IMPRESSION / MDM /  ASSESSMENT AND PLAN / ED COURSE  I reviewed the triage vital signs and the nursing notes.                              Differential diagnosis includes, but is not limited to, contact dermatitis, cellulitis, lupus  Patient's presentation is most consistent with acute illness / injury with system symptoms.   Will do a conservative therapy with treatment for contact dermatitis.  Patient given Decadron 10 mg IM here in the ED.  Sterapred to start tomorrow.  Follow-up with her regular doctor if not improving for blood work.  She is in agreement treatment plan.  Discharged stable condition.      FINAL CLINICAL IMPRESSION(S) / ED DIAGNOSES   Final diagnoses:  Contact dermatitis, unspecified contact dermatitis type, unspecified trigger     Rx / DC Orders   ED Discharge Orders  Ordered    predniSONE (STERAPRED UNI-PAK 21 TAB) 10 MG (21) TBPK tablet        09/15/23 2002             Note:  This document was prepared using Dragon voice recognition software and may include unintentional dictation errors.    Faythe Ghee, PA-C 09/15/23 2004    Corena Herter, MD 09/15/23 2015

## 2024-08-27 ENCOUNTER — Ambulatory Visit: Admitting: Allergy & Immunology

## 2024-09-15 ENCOUNTER — Encounter: Payer: Self-pay | Admitting: Allergy & Immunology

## 2024-09-15 ENCOUNTER — Ambulatory Visit (INDEPENDENT_AMBULATORY_CARE_PROVIDER_SITE_OTHER): Admitting: Allergy & Immunology

## 2024-09-15 ENCOUNTER — Other Ambulatory Visit: Payer: Self-pay

## 2024-09-15 DIAGNOSIS — J310 Chronic rhinitis: Secondary | ICD-10-CM

## 2024-09-15 DIAGNOSIS — L508 Other urticaria: Secondary | ICD-10-CM | POA: Diagnosis not present

## 2024-09-15 NOTE — Progress Notes (Unsigned)
 NEW PATIENT  Date of Service/Encounter:  09/15/24  Consult requested by: Lorel Maxie LABOR, MD   Assessment:   Chronic urticaria  Plan/Recommendations:   There are no Patient Instructions on file for this visit.   {Blank single:19197::This note in its entirety was forwarded to the Provider who requested this consultation.}  Subjective:   Colleen Meyer is a 48 y.o. female presenting today for evaluation of  Chief Complaint  Patient presents with   Urticaria    Has been waking up with hives, swollen lips, arms and thigh would be broken out with hives.    Colleen Meyer has a history of the following: Patient Active Problem List   Diagnosis Date Noted   LGSIL on Pap smear of cervix 12/27/2022    History obtained from: chart review and {Persons; PED relatives w/patient:19415::patient}.  Discussed the use of AI scribe software for clinical note transcription with the patient and/or guardian, who gave verbal consent to proceed.  Colleen Meyer was referred by Lorel Maxie LABOR, MD.     Colleen Meyer is a 48 y.o. female presenting for an evaluation of urticaria and environmental allergens.    Asthma/Respiratory Symptom History: ***  Allergic Rhinitis Symptom History: ***  Food Allergy Symptom History: ***  Skin Symptom History: ***  GERD Symptom History: ***  Infection Symptom History: ***  ***Otherwise, there is no history of other atopic diseases, including {Blank multiple:19196:o:asthma,food allergies,drug allergies,environmental allergies,stinging insect allergies,eczema,urticaria,contact dermatitis}. There is no significant infectious history. ***Vaccinations are up to date.    Past Medical History: Patient Active Problem List   Diagnosis Date Noted   LGSIL on Pap smear of cervix 12/27/2022    Medication List:  Allergies as of 09/15/2024   No Known Allergies      Medication List        Accurate as of September 15, 2024  2:35 PM. If you  have any questions, ask your nurse or doctor.          metroNIDAZOLE  500 MG tablet Commonly known as: FLAGYL  Take 1 tablet (500 mg total) by mouth 2 (two) times daily.   predniSONE  10 MG (21) Tbpk tablet Commonly known as: STERAPRED UNI-PAK 21 TAB Take 6 pills on day one then decrease by 1 pill each day        Birth History: {Blank single:19197::non-contributory,born premature and spent time in the NICU,born at term without complications}  Developmental History: [place] has met all milestones on time. She has required no {Blank multiple:19196:a:speech therapy,occupational therapy,physical therapy}. ***non-contributory  Past Surgical History: Past Surgical History:  Procedure Laterality Date   TUBAL LIGATION  1999     Family History: History reviewed. No pertinent family history.   Social History: Zyiah lives at home with ***.    Review of systems otherwise negative other than that mentioned in the HPI.    Objective:   There were no vitals taken for this visit. There is no height or weight on file to calculate BMI.     Physical Exam   Diagnostic studies: {Blank single:19197::none,deferred due to recent antihistamine use,deferred due to insurance stipulations that require a separate visit for testing,labs sent instead, }  Spirometry: {Blank single:19197::results normal (FEV1: ***%, FVC: ***%, FEV1/FVC: ***%),results abnormal (FEV1: ***%, FVC: ***%, FEV1/FVC: ***%)}.    {Blank single:19197::Spirometry consistent with mild obstructive disease,Spirometry consistent with moderate obstructive disease,Spirometry consistent with severe obstructive disease,Spirometry consistent with possible restrictive disease,Spirometry consistent with mixed obstructive and restrictive disease,Spirometry uninterpretable due to technique,Spirometry consistent with normal pattern}. {Blank  single:19197::Albuterol/Atrovent nebulizer,Xopenex/Atrovent  nebulizer,Albuterol nebulizer,Albuterol four puffs via MDI,Xopenex four puffs via MDI} treatment given in clinic with {Blank single:19197::significant improvement in FEV1 per ATS criteria,significant improvement in FVC per ATS criteria,significant improvement in FEV1 and FVC per ATS criteria,improvement in FEV1, but not significant per ATS criteria,improvement in FVC, but not significant per ATS criteria,improvement in FEV1 and FVC, but not significant per ATS criteria,no improvement}.  Allergy Studies: {Blank single:19197::none,deferred due to recent antihistamine use,deferred due to insurance stipulations that require a separate visit for testing,labs sent instead, }    {Blank single:19197::Allergy testing results were read and interpreted by myself, documented by clinical staff., }         Marty Shaggy, MD [facility]  

## 2024-09-15 NOTE — Patient Instructions (Addendum)
 1. Chronic urticaria - Your history does not have any red flags such as fevers, joint pains, or permanent skin changes that would be concerning for a more serious cause of hives.  - We will get some labs to rule out serious causes of hives: alpha gal syndrome, complete blood count, tryptase level, chronic urticaria panel, CMP, ESR, and CRP. - Chronic hives are often times a self limited process and will burn themselves out over 6-12 months, although this is not always the case.  - In the meantime, start suppressive dosing of antihistamines at the FIRST SIGN OF HIVES and continue for 3-5 days:   - Morning: Xyzal (levocetirizine) 1-2 tablets + Pepcid (famotidine) 20mg   - Evening: Zyrtec (cetirizine) 1-2 tablets + Pepcid (famotidine) 20mg  - You can change this dosing at home, decreasing the dose as needed or increasing the dosing as needed.   2. Chronic rhinitis - Because of insurance stipulations, we cannot do skin testing on the same day as your first visit. - We are all working to fight this, but for now we need to do two separate visits.  - We will know more after we do testing at the next visit.  - The skin testing visit can be squeezed in at your convenience.  - Then we can make a more full plan to address all of your symptoms. - Be sure to stop your antihistamines for 3 days before this appointment.   3. Return in about 2 months (around 11/15/2024) for SKIN TESTING (1-68). You can have the follow up appointment with Dr. Iva or a Nurse Practicioner (our Nurse Practitioners are excellent and always have Physician oversight!).    Please inform us  of any Emergency Department visits, hospitalizations, or changes in symptoms. Call us  before going to the ED for breathing or allergy symptoms since we might be able to fit you in for a sick visit. Feel free to contact us  anytime with any questions, problems, or concerns.  It was a pleasure to meet you today!  Websites that have reliable  patient information: 1. American Academy of Asthma, Allergy, and Immunology: www.aaaai.org 2. Food Allergy Research and Education (FARE): foodallergy.org 3. Mothers of Asthmatics: http://www.asthmacommunitynetwork.org 4. American College of Allergy, Asthma, and Immunology: www.acaai.org      "Like" us  on Facebook and Instagram for our latest updates!      A healthy democracy works best when [employer] participate! Make sure you are registered to vote! If you have moved or changed any of your contact information, you will need to get this updated before voting! Scan the QR codes below to learn more!

## 2024-09-18 ENCOUNTER — Ambulatory Visit: Payer: Self-pay | Admitting: Allergy & Immunology

## 2024-09-18 DIAGNOSIS — D509 Iron deficiency anemia, unspecified: Secondary | ICD-10-CM

## 2024-09-20 LAB — CBC WITH DIFF/PLATELET
Basophils Absolute: 0.1 x10E3/uL (ref 0.0–0.2)
Basos: 2 %
EOS (ABSOLUTE): 0.2 x10E3/uL (ref 0.0–0.4)
Eos: 4 %
Hematocrit: 29.2 % — ABNORMAL LOW (ref 34.0–46.6)
Hemoglobin: 7.5 g/dL — ABNORMAL LOW (ref 11.1–15.9)
Immature Grans (Abs): 0 x10E3/uL (ref 0.0–0.1)
Immature Granulocytes: 0 %
Lymphocytes Absolute: 1.2 x10E3/uL (ref 0.7–3.1)
Lymphs: 31 %
MCH: 17.6 pg — ABNORMAL LOW (ref 26.6–33.0)
MCHC: 25.7 g/dL — ABNORMAL LOW (ref 31.5–35.7)
MCV: 68 fL — ABNORMAL LOW (ref 79–97)
Monocytes Absolute: 0.3 x10E3/uL (ref 0.1–0.9)
Monocytes: 8 %
Neutrophils Absolute: 2.2 x10E3/uL (ref 1.4–7.0)
Neutrophils: 55 %
Platelets: 246 x10E3/uL (ref 150–450)
RBC: 4.27 x10E6/uL (ref 3.77–5.28)
RDW: 18.7 % — ABNORMAL HIGH (ref 11.7–15.4)
WBC: 3.9 x10E3/uL (ref 3.4–10.8)

## 2024-09-20 LAB — CMP14+EGFR
ALT: 12 IU/L (ref 0–32)
AST: 13 IU/L (ref 0–40)
Albumin: 4.1 g/dL (ref 3.9–4.9)
Alkaline Phosphatase: 73 IU/L (ref 41–116)
BUN/Creatinine Ratio: 13 (ref 9–23)
BUN: 9 mg/dL (ref 6–24)
Bilirubin Total: 0.3 mg/dL (ref 0.0–1.2)
CO2: 22 mmol/L (ref 20–29)
Calcium: 9.9 mg/dL (ref 8.7–10.2)
Chloride: 107 mmol/L — ABNORMAL HIGH (ref 96–106)
Creatinine, Ser: 0.69 mg/dL (ref 0.57–1.00)
Globulin, Total: 2.6 g/dL (ref 1.5–4.5)
Glucose: 83 mg/dL (ref 70–99)
Potassium: 4.8 mmol/L (ref 3.5–5.2)
Sodium: 140 mmol/L (ref 134–144)
Total Protein: 6.7 g/dL (ref 6.0–8.5)
eGFR: 107 mL/min/1.73 (ref >59)

## 2024-09-20 LAB — ALPHA-GAL PANEL
Allergen Lamb IgE: <0.1 kU/L
Beef IgE: <0.1 kU/L
IgE (Immunoglobulin E), Serum: 58 [IU]/mL (ref 6–495)
O215-IgE Alpha-Gal: <0.1 kU/L
Pork IgE: <0.1 kU/L

## 2024-09-20 LAB — TRYPTASE: Tryptase: 2.1 ug/L — ABNORMAL LOW (ref 2.2–13.2)

## 2024-09-20 LAB — CHRONIC URTICARIA PD-BAT: Pooled Donor- BAT CU: 8.72 % (ref 0.00–10.60)

## 2024-09-20 LAB — SEDIMENTATION RATE: Sed Rate: 24 mm/h (ref 0–32)

## 2024-09-20 LAB — THYROID ANTIBODIES (THYROPEROXIDASE & THYROGLOBULIN)
Thyroglobulin Antibody: <1 [IU]/mL (ref 0.0–0.9)
Thyroperoxidase Ab SerPl-aCnc: 18 [IU]/mL (ref 0–34)

## 2024-09-20 LAB — ANTINUCLEAR ANTIBODIES, IFA: ANA Titer 1: NEGATIVE

## 2024-09-20 LAB — C-REACTIVE PROTEIN: CRP: <1 mg/L (ref 0–10)

## 2024-09-25 NOTE — Telephone Encounter (Signed)
 Faxed lab results to PCP

## 2024-11-17 ENCOUNTER — Ambulatory Visit: Admitting: Allergy & Immunology

## 2024-12-03 ENCOUNTER — Ambulatory Visit (INDEPENDENT_AMBULATORY_CARE_PROVIDER_SITE_OTHER): Admitting: Allergy & Immunology

## 2024-12-03 ENCOUNTER — Encounter: Payer: Self-pay | Admitting: Allergy & Immunology

## 2024-12-03 DIAGNOSIS — J3089 Other allergic rhinitis: Secondary | ICD-10-CM

## 2024-12-03 DIAGNOSIS — J302 Other seasonal allergic rhinitis: Secondary | ICD-10-CM

## 2024-12-03 DIAGNOSIS — L508 Other urticaria: Secondary | ICD-10-CM | POA: Diagnosis not present

## 2024-12-03 NOTE — Patient Instructions (Addendum)
 1. Chronic urticaria - Your history does not have any red flags such as fevers, joint pains, or permanent skin changes that would be concerning for a more serious cause of hives.  - We did some more labs to look into your anemia.  - We will get back in touch with you about the results of the testing.  - Chronic hives are often times a self limited process and will burn themselves out over 6-12 months, although this is not always the case.  - In the meantime, start suppressive dosing of antihistamines at the FIRST SIGN OF HIVES and continue for 3-5 days:   - Morning: Xyzal (levocetirizine) 1-2 tablets + Pepcid (famotidine) 20mg   - Evening: Zyrtec (cetirizine) 1-2 tablets + Pepcid (famotidine) 20mg   2. Chronic rhinitis - Testing today showed: weeds, trees, and dust mites - Copy of test results provided.  - Avoidance measures provided. - Start taking: Xyzal (levocetirizine) 5mg  tablet once daily - You can use an extra dose of the antihistamine, if needed, for breakthrough symptoms.  - Consider nasal saline rinses 1-2 times daily to remove allergens from the nasal cavities as well as help with mucous clearance (this is especially helpful to do before the nasal sprays are given) - Strongly allergy shots as a means of long-term control, although dust mite avoidance measures might just do the trick.  - Allergy shots re-train and reset the immune system to ignore environmental allergens and decrease the resulting immune response to those allergens (sneezing, itchy watery eyes, runny nose, nasal congestion, etc).    - Allergy shots improve symptoms in 75-85% of patients.  - We can discuss more at the next appointment if the medications are not working for you.  3. Return in about 3 months (around 03/03/2025). You can have the follow up appointment with Dr. Iva or a Nurse Practicioner (our Nurse Practitioners are excellent and always have Physician oversight!).    Please inform us  of any  Emergency Department visits, hospitalizations, or changes in symptoms. Call us  before going to the ED for breathing or allergy symptoms since we might be able to fit you in for a sick visit. Feel free to contact us  anytime with any questions, problems, or concerns.  It was a pleasure to meet you today!  Websites that have reliable patient information: 1. American Academy of Asthma, Allergy, and Immunology: www.aaaai.org 2. Food Allergy Research and Education (FARE): foodallergy.org 3. Mothers of Asthmatics: http://www.asthmacommunitynetwork.org 4. American College of Allergy, Asthma, and Immunology: www.acaai.org      Like us  on [employer] and Instagram for our latest updates!      A healthy democracy works best when [employer] participate! Make sure you are registered to vote! If you have moved or changed any of your contact information, you will need to get this updated before voting! Scan the QR codes below to learn more!        Airborne Adult Perc - 12/03/24 1411     Time Antigen Placed 1411    Allergen Manufacturer Jestine    Location Back    Number of Test 55    1. Control-Buffer 50% Glycerol Negative    2. Control-Histamine 3+    3. Bahia Negative    4. Bermuda Negative    5. Johnson Negative    6. [place]  Blue Negative    7. Meadow Fescue Negative    8. Perennial Rye Negative    9. Timothy Negative    10. Ragweed Mix Negative  11. Cocklebur 2+    12. Plantain,  English Negative    13. Baccharis Negative    14. Dog Fennel Negative    15. Russian Thistle 2+    16. Lamb's Quarters Negative    17. Sheep Sorrell 2+    18. Rough Pigweed Negative    19. Marsh Elder, Rough Negative    20. Mugwort, Common Negative    21. Box, Elder Negative    22. Cedar, red Negative    23. Sweet Gum 2+    24. Pecan Pollen 2+    25. Pine Mix 2+    26. Walnut, Black Pollen Negative    27. Red Mulberry Negative    28. Ash Mix Negative    29. Birch Mix Negative    30. Beech  American Negative    31. Cottonwood, Eastern Negative    32. Hickory, White Negative    33. Maple Mix Negative    34. Oak, Eastern Mix Negative    35. Sycamore Eastern Negative    36. Alternaria Alternata Negative    37. Cladosporium Herbarum Negative    38. Aspergillus Mix Negative    39. Penicillium Mix Negative    40. Bipolaris Sorokiniana (Helminthosporium) Negative    41. Drechslera Spicifera (Curvularia) Negative    42. Mucor Plumbeus Negative    43. Fusarium Moniliforme Negative    44. Aureobasidium Pullulans (pullulara) Negative    45. Rhizopus Oryzae Negative    46. Botrytis Cinera Negative    47. Epicoccum Nigrum Negative    48. Phoma Betae Negative    49. Dust Mite Mix Negative    50. Cat Hair 10,000 BAU/ml Negative    51.  Dog Epithelia Negative    52. Mixed Feathers Negative    53. Horse Epithelia Negative    54. Cockroach, German Negative    55. Tobacco Leaf Negative          13 Food Perc - 12/03/24 1411       Test Information   Time Antigen Placed 1411    Allergen Manufacturer Jestine    Location Back    Number of allergen test 13      Food   1. Peanut Negative    2. Soybean Negative    3. Wheat Negative    4. Sesame Negative    5. Milk, Cow Negative    6. Casein Negative    7. Egg White, Chicken Negative    8. Shellfish Mix Negative    9. Fish Mix Negative    10. Cashew Negative    11. Walnut Food Negative    12. Almond Negative    13. Hazelnut Negative          Intradermal - 12/03/24 1434     Time Antigen Placed 1500    Allergen Manufacturer Jestine    Location Arm    Control Negative    Bahia Negative    Bermuda Negative    Johnson Negative    7 Grass Negative    Ragweed Mix Negative    Mold 1 Negative    Mold 2 Negative    Mold 3 Negative    Mold 4 Negative    Mite Mix 4+    Cat Negative    Dog Negative    Cockroach Negative          Reducing Pollen Exposure  The American Academy of Allergy, Asthma and Immunology  suggests the following steps to reduce your exposure to pollen  during allergy seasons.    Do not hang sheets or clothing out to dry; pollen may collect on these items. Do not mow lawns or spend time around freshly cut grass; mowing stirs up pollen. Keep windows closed at night.  Keep car windows closed while driving. Minimize morning activities outdoors, a time when pollen counts are usually at their highest. Stay indoors as much as possible when pollen counts or humidity is high and on windy days when pollen tends to remain in the air longer. Use air conditioning when possible.  Many air conditioners have filters that trap the pollen spores. Use a HEPA room air filter to remove pollen form the indoor air you breathe.  Control of Dust Mite Allergen    Dust mites play a major role in allergic asthma and rhinitis.  They occur in environments with high humidity wherever human skin is found.  Dust mites absorb humidity from the atmosphere (ie, they do not drink) and feed on organic matter (including shed human and animal skin).  Dust mites are a microscopic type of insect that you cannot see with the naked eye.  High levels of dust mites have been detected from mattresses, pillows, carpets, upholstered furniture, bed covers, clothes, soft toys and any woven material.  The principal allergen of the dust mite is found in its feces.  A gram of dust may contain 1,000 mites and 250,000 fecal particles.  Mite antigen is easily measured in the air during house cleaning activities.  Dust mites do not bite and do not cause harm to humans, other than by triggering allergies/asthma.    Ways to decrease your exposure to dust mites in your home:  Encase mattresses, box springs and pillows with a mite-impermeable barrier or cover   Wash sheets, blankets and drapes weekly in hot water (130 F) with detergent and dry them in a dryer on the hot setting.  Have the room cleaned frequently with a vacuum cleaner and a  damp dust-mop.  For carpeting or rugs, vacuuming with a vacuum cleaner equipped with a high-efficiency particulate air (HEPA) filter.  The dust mite allergic individual should not be in a room which is being cleaned and should wait 1 hour after cleaning before going into the room. Do not sleep on upholstered furniture (eg, couches).   If possible removing carpeting, upholstered furniture and drapery from the home is ideal.  Horizontal blinds should be eliminated in the rooms where the person spends the most time (bedroom, study, television room).  Washable vinyl, roller-type shades are optimal. Remove all non-washable stuffed toys from the bedroom.  Wash stuffed toys weekly like sheets and blankets above.   Reduce indoor humidity to less than 50%.  Inexpensive humidity monitors can be purchased at most hardware stores.  Do not use a humidifier as can make the problem worse and are not recommended.  Allergy Shots  Allergies are the result of a chain reaction that starts in the immune system. Your immune system controls how your body defends itself. For instance, if you have an allergy to pollen, your immune system identifies pollen as an invader or allergen. Your immune system overreacts by producing antibodies called Immunoglobulin E (IgE). These antibodies travel to cells that release chemicals, causing an allergic reaction.  The concept behind allergy immunotherapy, whether it is received in the form of shots or tablets, is that the immune system can be desensitized to specific allergens that trigger allergy symptoms. Although it requires time and patience, the payback  can be long-term relief. Allergy injections contain a dilute solution of those substances that you are allergic to based upon your skin testing and allergy history.   How Do Allergy Shots Work?  Allergy shots work much like a vaccine. Your body responds to injected amounts of a particular allergen given in increasing doses, eventually  developing a resistance and tolerance to it. Allergy shots can lead to decreased, minimal or no allergy symptoms.  There generally are two phases: build-up and maintenance. Build-up often ranges from three to six months and involves receiving injections with increasing amounts of the allergens. The shots are typically given once or twice a week, though more rapid build-up schedules are sometimes used.  The maintenance phase begins when the most effective dose is reached. This dose is different for each person, depending on how allergic you are and your response to the build-up injections. Once the maintenance dose is reached, there are longer periods between injections, typically two to four weeks.  Occasionally doctors give cortisone-type shots that can temporarily reduce allergy symptoms. These types of shots are different and should not be confused with allergy immunotherapy shots.  Who Can Be Treated with Allergy Shots?  Allergy shots may be a good treatment approach for people with allergic rhinitis (hay fever), allergic asthma, conjunctivitis (eye allergy) or stinging insect allergy.   Before deciding to begin allergy shots, you should consider:   The length of allergy season and the severity of your symptoms  Whether medications and/or changes to your environment can control your symptoms  Your desire to avoid long-term medication use  Time: allergy immunotherapy requires a major time commitment  Cost: may vary depending on your insurance coverage  Allergy shots for children age 74 and older are effective and often well tolerated. They might prevent the onset of new allergen sensitivities or the progression to asthma.  Allergy shots are not started on patients who are pregnant but can be continued on patients who become pregnant while receiving them. In some patients with other medical conditions or who take certain common medications, allergy shots may be of risk. It is important to  mention other medications you talk to your allergist.   What are the two types of build-ups offered:   RUSH or Rapid Desensitization -- one day of injections lasting from 8:30-4:30pm, injections every 1 hour.  Approximately half of the build-up process is completed in that one day.  The following week, normal build-up is resumed, and this entails ~16 visits either weekly or twice weekly, until reaching your maintenance dose which is continued weekly until eventually getting spaced out to every month for a duration of 3 to 5 years. The regular build-up appointments are nurse visits where the injections are administered, followed by required monitoring for 30 minutes.    Traditional build-up -- weekly visits for 6 -12 months until reaching maintenance dose, then continue weekly until eventually spacing out to every 4 weeks as above. At these appointments, the injections are administered, followed by required monitoring for 30 minutes.     Either way is acceptable, and both are equally effective. With the rush protocol, the advantage is that less time is spent here for injections overall AND you would also reach maintenance dosing faster (which is when the clinical benefit starts to become more apparent). Not everyone is a candidate for rapid desensitization.   IF we proceed with the RUSH protocol, there are premedications which must be taken the day before and the day after  the rush only (this includes antihistamines, steroids, and Singulair).  After the rush day, no prednisone  or Singulair is required, and we just recommend antihistamines taken on your injection day.  What Is An Estimate of the Costs?  If you are interested in starting allergy injections, please check with your insurance company about your coverage for both allergy vial sets and allergy injections.  Please do so prior to making the appointment to start injections.  The following are CPT codes to give to your insurance company.  These are the amounts we BILL to the insurance company, but the amount YOU WILL PAY and WE RECEIVE IS SUBSTANTIALLY LESS and depends on the contracts we have with different insurance companies.   Amount Billed to Insurance One allergy vial set  CPT 95165   $ 1200     Two allergy vial set  CPT 95165   $ 2400     Three allergy vial set  CPT 95165   $ 3600     One injection   CPT 95115   $ 35  Two injections   CPT 95117   $ 40 RUSH (Rapid Desensitization) CPT 95180 x 8 hours $500/hour  Regarding the allergy injections, your co-pay may or may not apply with each injection, so please confirm this with your insurance company. When you start allergy injections, 1 or 2 sets of vials are made based on your allergies.  Not all patients can be on one set of vials. A set of vials lasts 6 months to a year depending on how quickly you can proceed with your build-up of your allergy injections. Vials are personalized for each patient depending on their specific allergens.  How often are allergy injection given during the build-up period?   Injections are given at least weekly during the build-up period until your maintenance dose is achieved. Per the doctor's discretion, you may have the option of getting allergy injections two times per week during the build-up period. However, there must be at least 48 hours between injections. The build-up period is usually completed within 6-12 months depending on your ability to schedule injections and for adjustments for reactions. When maintenance dose is reached, your injection schedule is gradually changed to every two weeks and later to every three weeks. Injections will then continue every 4 weeks. Usually, injections are continued for a total of 3-5 years.   When Will I Feel Better?  Some may experience decreased allergy symptoms during the build-up phase. For others, it may take as long as 12 months on the maintenance dose. If there is no improvement after a year of  maintenance, your allergist will discuss other treatment options with you.  If you arent responding to allergy shots, it may be because there is not enough dose of the allergen in your vaccine or there are missing allergens that were not identified during your allergy testing. Other reasons could be that there are high levels of the allergen in your environment or major exposure to non-allergic triggers like tobacco smoke.  What Is the Length of Treatment?  Once the maintenance dose is reached, allergy shots are generally continued for three to five years. The decision to stop should be discussed with your allergist at that time. Some people may experience a permanent reduction of allergy symptoms. Others may relapse and a longer course of allergy shots can be considered.  What Are the Possible Reactions?  The two types of adverse reactions that can occur with allergy shots are  local and systemic. Common local reactions include very mild redness and swelling at the injection site, which can happen immediately or several hours after. Report a delayed reaction from your last injection. These include arm swelling or runny nose, watery eyes or cough that occurs within 12-24 hours after injection. A systemic reaction, which is less common, affects the entire body or a particular body system. They are usually mild and typically respond quickly to medications. Signs include increased allergy symptoms such as sneezing, a stuffy nose or hives.   Rarely, a serious systemic reaction called anaphylaxis can develop. Symptoms include swelling in the throat, wheezing, a feeling of tightness in the chest, nausea or dizziness. Most serious systemic reactions develop within 30 minutes of allergy shots. This is why it is strongly recommended you wait in your doctors office for 30 minutes after your injections. Your allergist is trained to watch for reactions, and his or her staff is trained and equipped with the proper  medications to identify and treat them.   Report to the nurse immediately if you experience any of the following symptoms: swelling, itching or redness of the skin, hives, watery eyes/nose, breathing difficulty, excessive sneezing, coughing, stomach pain, diarrhea, or light headedness. These symptoms may occur within 15-20 minutes after injection and may require medication.   Who Should Administer Allergy Shots?  The preferred location for receiving shots is your prescribing allergists office. Injections can sometimes be given at another facility where the physician and staff are trained to recognize and treat reactions, and have received instructions by your prescribing allergist.  What if I am late for an injection?   Injection dose will be adjusted depending upon how many days or weeks you are late for your injection.   What if I am sick?   Please report any illness to the nurse before receiving injections. She may adjust your dose or postpone injections depending on your symptoms. If you have fever, flu, sinus infection or chest congestion it is best to postpone allergy injections until you are better. Never get an allergy injection if your asthma is causing you problems. If your symptoms persist, seek out medical care to get your health problem under control.  What If I am or Become Pregnant:  Women that become pregnant should schedule an appointment with [facility] before receiving any further allergy injections.

## 2024-12-03 NOTE — Progress Notes (Signed)
 FOLLOW UP  Date of Service/Encounter:  12/03/2024   Assessment:   Seasonal and perennial allergic rhinitis (weeds, trees, and dust mites)  Chronic urticaria  Anemia - getting some more labs to look into this  Plan/Recommendations:   1. Chronic urticaria - Your history does not have any red flags such as fevers, joint pains, or permanent skin changes that would be concerning for a more serious cause of hives.  - We did some more labs to look into your anemia.  - We will get back in touch with you about the results of the testing.  - Chronic hives are often times a self limited process and will burn themselves out over 6-12 months, although this is not always the case.  - In the meantime, start suppressive dosing of antihistamines at the FIRST SIGN OF HIVES and continue for 3-5 days:   - Morning: Xyzal (levocetirizine) 1-2 tablets + Pepcid (famotidine) 20mg   - Evening: Zyrtec (cetirizine) 1-2 tablets + Pepcid (famotidine) 20mg   2. Chronic rhinitis - Testing today showed: weeds, trees, and dust mites - Copy of test results provided.  - Avoidance measures provided. - Start taking: Xyzal (levocetirizine) 5mg  tablet once daily - You can use an extra dose of the antihistamine, if needed, for breakthrough symptoms.  - Consider nasal saline rinses 1-2 times daily to remove allergens from the nasal cavities as well as help with mucous clearance (this is especially helpful to do before the nasal sprays are given) - Strongly allergy shots as a means of long-term control, although dust mite avoidance measures might just do the trick.  - Allergy shots re-train and reset the immune system to ignore environmental allergens and decrease the resulting immune response to those allergens (sneezing, itchy watery eyes, runny nose, nasal congestion, etc).    - Allergy shots improve symptoms in 75-85% of patients.  - We can discuss more at the next appointment if the medications are not working  for you.  3. Return in about 3 months (around 03/03/2025). You can have the follow up appointment with Dr. Iva or a Nurse Practicioner (our Nurse Practitioners are excellent and always have Physician oversight!).   Subjective:   Colleen Meyer is a 48 y.o. female presenting today for follow up of No chief complaint on file.   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  Discussed the use of AI scribe software for clinical note transcription with the patient and/or guardian, who gave verbal consent to proceed.  Colleen Meyer is a 48 y.o. female presenting for skin testing. She was last seen on September 23rd. We could not do testing because her insurance company does not cover testing on the same day as a New Patient visit. She has been off of all antihistamines 3 days in anticipation of the testing.   At that visit, we got some labs to rule out wares causes of hives.  We started her on Xyzal and Pepcid in the morning and Zyrtec and Pepcid at night.  For her rhinitis, we decided to do environmental allergy testing.   She has not experienced any hives or itching recently. Previously, she had episodes of hives primarily occurring indoors. She is not currently taking antihistamines, and the symptoms seem to have resolved on her own. She recalls a recent incident of swelling after eating at a  restaurant, but she did not consume anything unusual or different from her regular diet.  She has a history of low hemoglobin and hematocrit levels, which were noted in previous lab work. She has not yet completed follow-up labs to reassess these levels. She denies any history of low blood counts or significant issues with iron levels in the past, except for the current low iron levels. She is taking an iron supplement but has not received any infusions.  No  specific time of year when her allergies worsen and no significant issues with pollen, trees, or weeds. No recent itching except for the positive control during testing.  Otherwise, there have been no changes to her past medical history, surgical history, family history, or social history.    Review of systems otherwise negative other than that mentioned in the HPI.    Objective:   There were no vitals taken for this visit. There is no height or weight on file to calculate BMI.    Physical exam deferred since this was a skin testing appointment only.   Diagnostic studies:   Allergy Studies:     Airborne Adult Perc - 12/03/24 1411     Time Antigen Placed 1411    Allergen Manufacturer Jestine    Location Back    Number of Test 55    1. Control-Buffer 50% Glycerol Negative    2. Control-Histamine 3+    3. Bahia Negative    4. Bermuda Negative    5. Johnson Negative    6. Kentucky  Blue Negative    7. Meadow Fescue Negative    8. Perennial Rye Negative    9. Timothy Negative    10. Ragweed Mix Negative    11. Cocklebur 2+    12. Plantain,  English Negative    13. Baccharis Negative    14. Dog Fennel Negative    15. Russian Thistle 2+    16. Lamb's Quarters Negative    17. Sheep Sorrell 2+    18. Rough Pigweed Negative    19. Marsh Elder, Rough Negative    20. Mugwort, Common Negative    21. Box, Elder Negative    22. Cedar, red Negative    23. Sweet Gum 2+    24. Pecan Pollen 2+    25. Pine Mix 2+    26. Walnut, Black Pollen Negative    27. Red Mulberry Negative    28. Ash Mix Negative    29. Birch Mix Negative    30. Beech American Negative    31. Cottonwood, Eastern Negative    32. Hickory, White Negative    33. Maple Mix Negative    34. Oak, Eastern Mix Negative    35. Sycamore Eastern Negative    36. Alternaria Alternata Negative    37. Cladosporium Herbarum Negative    38. Aspergillus Mix Negative    39. Penicillium Mix Negative    40. Bipolaris  Sorokiniana (Helminthosporium) Negative    41. Drechslera Spicifera (Curvularia) Negative    42. Mucor Plumbeus Negative    43. Fusarium Moniliforme Negative    44. Aureobasidium Pullulans (pullulara) Negative    45. Rhizopus Oryzae Negative    46. Botrytis Cinera Negative    47. Epicoccum Nigrum Negative    48. Phoma Betae Negative    49. Dust Mite Mix Negative    50. Cat Hair 10,000 BAU/ml Negative    51.  Dog Epithelia Negative    52. Mixed Feathers Negative    53.  Horse Epithelia Negative    54. Cockroach, German Negative    55. Tobacco Leaf Negative          13 Food Perc - 12/03/24 1411       Test Information   Time Antigen Placed 1411    Allergen Manufacturer Jestine    Location Back    Number of allergen test 13      Food   1. Peanut Negative    2. Soybean Negative    3. Wheat Negative    4. Sesame Negative    5. Milk, Cow Negative    6. Casein Negative    7. Egg White, Chicken Negative    8. Shellfish Mix Negative    9. Fish Mix Negative    10. Cashew Negative    11. Walnut Food Negative    12. Almond Negative    13. Hazelnut Negative          Intradermal - 12/03/24 1434     Time Antigen Placed 1500    Allergen Manufacturer Jestine    Location Arm    Control Negative    Bahia Negative    Bermuda Negative    Johnson Negative    7 Grass Negative    Ragweed Mix Negative    Mold 1 Negative    Mold 2 Negative    Mold 3 Negative    Mold 4 Negative    Mite Mix 4+    Cat Negative    Dog Negative    Cockroach Negative          Allergy testing results were read and interpreted by myself, documented by clinical staff.      Marty Shaggy, MD  [facility] 

## 2024-12-04 LAB — CBC WITH DIFF/PLATELET
Basophils Absolute: 0.1 x10E3/uL (ref 0.0–0.2)
Basos: 2 %
EOS (ABSOLUTE): 0.1 x10E3/uL (ref 0.0–0.4)
Eos: 3 %
Hematocrit: 28.2 % — ABNORMAL LOW (ref 34.0–46.6)
Hemoglobin: 7.4 g/dL — ABNORMAL LOW (ref 11.1–15.9)
Immature Grans (Abs): 0 x10E3/uL (ref 0.0–0.1)
Immature Granulocytes: 0 %
Lymphocytes Absolute: 1.1 x10E3/uL (ref 0.7–3.1)
Lymphs: 23 %
MCH: 17.6 pg — ABNORMAL LOW (ref 26.6–33.0)
MCHC: 26.2 g/dL — ABNORMAL LOW (ref 31.5–35.7)
MCV: 67 fL — ABNORMAL LOW (ref 79–97)
Monocytes Absolute: 0.4 x10E3/uL (ref 0.1–0.9)
Monocytes: 8 %
Neutrophils Absolute: 3 x10E3/uL (ref 1.4–7.0)
Neutrophils: 64 %
Platelets: 215 x10E3/uL (ref 150–450)
RBC: 4.21 x10E6/uL (ref 3.77–5.28)
RDW: 16.7 % — ABNORMAL HIGH (ref 11.7–15.4)
WBC: 4.6 x10E3/uL (ref 3.4–10.8)

## 2024-12-04 LAB — IRON,TIBC AND FERRITIN PANEL
Ferritin: 3 ng/mL — ABNORMAL LOW (ref 15–150)
Iron Saturation: 3 % — CL (ref 15–55)
Iron: 11 ug/dL — ABNORMAL LOW (ref 27–159)
Total Iron Binding Capacity: 439 ug/dL (ref 250–450)
UIBC: 428 ug/dL — ABNORMAL HIGH (ref 131–425)

## 2024-12-09 NOTE — Addendum Note (Signed)
 Addended by: IVA MARTY SALTNESS on: 12/09/2024 08:58 AM   Modules accepted: Orders

## 2024-12-21 ENCOUNTER — Telehealth: Payer: Self-pay | Admitting: Allergy & Immunology

## 2024-12-21 NOTE — Telephone Encounter (Signed)
 Colleen Meyer is scheduled with [facility] for 12/25/24 at 2:00 pm with Dr. Rennie and with [facility] for 02/18/25 at 11:00 am with Dr. Aundria.

## 2024-12-25 ENCOUNTER — Inpatient Hospital Stay

## 2024-12-25 ENCOUNTER — Encounter: Payer: Self-pay | Admitting: Internal Medicine

## 2024-12-25 ENCOUNTER — Inpatient Hospital Stay: Attending: Internal Medicine | Admitting: Internal Medicine

## 2024-12-25 VITALS — BP 110/59 | HR 78 | Temp 98.0°F | Resp 16 | Ht 64.0 in | Wt 187.2 lb

## 2024-12-25 DIAGNOSIS — D509 Iron deficiency anemia, unspecified: Secondary | ICD-10-CM | POA: Insufficient documentation

## 2024-12-25 DIAGNOSIS — D649 Anemia, unspecified: Secondary | ICD-10-CM | POA: Insufficient documentation

## 2024-12-25 DIAGNOSIS — N926 Irregular menstruation, unspecified: Secondary | ICD-10-CM | POA: Diagnosis not present

## 2024-12-25 NOTE — Assessment & Plan Note (Addendum)
#   Anemia- Hb-symptomatic.  Likely due to iron deficiency - from etiology GI blood loss/menorrhagia.  Hemoglobin 7.4; ferritin is 3 send iron saturation is 3%-#2 DVT 5.  Poor tolerance/lack of improvement on oral iron.  Discussed regarding IV iron infusion/Venofer. Discussed the potential acute infusion reactions with IV iron; which are quite rare.  Patient understands the risk; will proceed with infusions.   #Etiology of iron deficiency: Likely secondary to menstrual cycles; patient also knows that she will need a GI evaluation for screening colonoscopy.  Thank you Dr.Gallaher MD for allowing me to participate in the care of your pleasant patient. Please do not hesitate to contact me with questions or concerns in the interim.  Tube tied-no preg test  # DISPOSITION: # NO labs today;  # weekly venofer x 4 # follow up 3 months MD; 2-3 days prior labs- cbc/cmp; LDH; iron studies; ferritin- retic count; B12; possible venofer- Dr.B

## 2024-12-25 NOTE — Progress Notes (Signed)
 Fatigue/weakness: YES Dyspena: NO  Light headedness: YES Blood in stool: NO

## 2024-12-25 NOTE — Progress Notes (Signed)
 [facility] CONSULT NOTE  Patient Care Team: Lorel Maxie LABOR, MD as PCP - General (Family Medicine) Cindie Jesusa HERO, RN as Registered Nurse Dannielle Arlean FALCON, RN (Inactive) as Registered Nurse Rennie Cindy SAUNDERS, MD as Consulting Physician (Oncology)  CHIEF COMPLAINTS/PURPOSE OF CONSULTATION: ANEMIA   HEMATOLOGY HISTORY  # ANEMIA[Hb; MCV-platelets- WBC; Iron sat; ferritin;  GFR- CT/US - ;    Latest Reference Range & Units 12/03/24 15:11  Iron 27 - 159 ug/dL 11 (L)  UIBC 868 - 574 ug/dL 571 (H)  TIBC 749 - 549 ug/dL 560  Ferritin 15 - 849 ng/mL 3 (L)  Iron Saturation 15 - 55 % 3 (LL)   (LL): Data is critically low (L): Data is abnormally low (H): Data is abnormally high  HISTORY OF PRESENTING ILLNESS:  Colleen Meyer 49 y.o.  female pleasant patient is  been referred to us  for further evaluation of anemia.  Discussed the use of AI scribe software for clinical note transcription with the patient, who gave verbal consent to proceed.  History of Present Illness   Colleen Meyer is a 49 year old female with iron deficiency anemia who presents for evaluation of persistently low hemoglobin despite oral iron therapy.  She has had persistently low hemoglobin since September 2025, with initial hemoglobin of 7.5 g/dL and subsequent testing after three months of oral iron supplementation showing no improvement (hemoglobin 7.4 g/dL) and continued low iron levels. She took oral iron for approximately two months but did not experience improvement and developed gastrointestinal side effects, including constipation and abdominal discomfort. She has not received prior blood transfusions, iron infusions, or bone marrow biopsy.  She describes her menses as very irregular, typically lasting three days with one day of heavy bleeding, but occasionally persisting up to a month. She characterizes her periods as overall heavy. She denies hematochezia, hematuria, and has not undergone  colonoscopy or upper endoscopy. She has no history of kidney or liver disease, bariatric surgery, or prior gastrointestinal bleeding.  She has experienced unintentional weight loss over the past year, with a decrease from 225 lbs to 187 lbs, most pronounced over the last six months. She notes decreased appetite, early satiety, and irregular eating patterns. She endorses fatigue, occasional lightheadedness at work (especially when bending over), headaches, insomnia, and symptoms of restless legs. She denies dyspnea on exertion, chest pain, abdominal pain, nausea, vomiting, or dysphagia.      Blood in stools: none; EGD/colonoscopy-none Blood in urine:none Difficulty swallowing::none Change of bowel movement/constipation: yes- with PO iron.  Prior blood transfusion: :none Kidney/Liver disease: :none Alcohol: social  Bariatric surgery::none   Vaginal bleeding: irregular- but heavy.  Prior evaluation with hematology: :none Prior bone marrow biopsy: :none Prior IV iron infusions::none   Review of Systems  Constitutional:  Positive for malaise/fatigue and weight loss. Negative for chills, diaphoresis and fever.  HENT:  Negative for nosebleeds and sore throat.   Eyes:  Negative for double vision.  Respiratory:  Negative for cough, hemoptysis, sputum production, shortness of breath and wheezing.   Cardiovascular:  Negative for chest pain, palpitations, orthopnea and leg swelling.  Gastrointestinal:  Negative for abdominal pain, blood in stool, constipation, diarrhea, heartburn, melena, nausea and vomiting.  Genitourinary:  Negative for dysuria, frequency and urgency.  Musculoskeletal:  Negative for back pain and joint pain.  Skin: Negative.  Negative for itching and rash.  Neurological:  Positive for dizziness. Negative for tingling, focal weakness, weakness and headaches.  Endo/Heme/Allergies:  Does not bruise/bleed easily.  Psychiatric/Behavioral:  Negative  for depression. The patient is not  nervous/anxious and does not have insomnia.      MEDICAL HISTORY:  History reviewed. No pertinent past medical history.  SURGICAL HISTORY: Past Surgical History:  Procedure Laterality Date   TUBAL LIGATION  1999    SOCIAL HISTORY: Social History   Socioeconomic History   Marital status: Single    Spouse name: Not on file   Number of children: Not on file   Years of education: Not on file   Highest education level: Not on file  Occupational History   Not on file  Tobacco Use   Smoking status: Never   Smokeless tobacco: Never  Vaping Use   Vaping status: Never Used  Substance and Sexual Activity   Alcohol use: Yes    Comment: SOCIAL   Drug use: Never   Sexual activity: Yes    Birth control/protection: None  Other Topics Concern   Not on file  Social History Narrative   Not on file   Social Drivers of Health   Tobacco Use: Low Risk (12/25/2024)   Patient History    Smoking Tobacco Use: Never    Smokeless Tobacco Use: Never    Passive Exposure: Not on file  Financial Resource Strain: Not on file  Food Insecurity: No Food Insecurity (12/25/2024)   Epic    Worried About Programme Researcher, Broadcasting/film/video in the Last Year: Never true    Ran Out of Food in the Last Year: Never true  Recent Concern: Food Insecurity - Food Insecurity Present (12/24/2024)   Epic    Worried About Programme Researcher, Broadcasting/film/video in the Last Year: Sometimes true    Ran Out of Food in the Last Year: Sometimes true  Transportation Needs: No Transportation Needs (12/25/2024)   Epic    Lack of Transportation (Medical): No    Lack of Transportation (Non-Medical): No  Recent Concern: Transportation Needs - Unmet Transportation Needs (12/24/2024)   Epic    Lack of Transportation (Medical): No    Lack of Transportation (Non-Medical): Yes  Physical Activity: Not on file  Stress: Not on file  Social Connections: Not on file  Intimate Partner Violence: Not At Risk (12/25/2024)   Epic    Fear of Current or Ex-Partner: No     Emotionally Abused: No    Physically Abused: No    Sexually Abused: No  Depression (PHQ2-9): Low Risk (12/25/2024)   Depression (PHQ2-9)    PHQ-2 Score: 0  Alcohol Screen: Not on file  Housing: Low Risk (12/25/2024)   Epic    Unable to Pay for Housing in the Last Year: No    Number of Times Moved in the Last Year: 0    Homeless in the Last Year: No  Recent Concern: Housing - High Risk (12/24/2024)   Epic    Unable to Pay for Housing in the Last Year: Yes    Number of Times Moved in the Last Year: 0    Homeless in the Last Year: No  Utilities: Not At Risk (12/25/2024)   Epic    Threatened with loss of utilities: No  Recent Concern: Utilities - At Risk (12/24/2024)   Epic    Threatened with loss of utilities: Yes  Health Literacy: Not on file    FAMILY HISTORY: Family History  Problem Relation Age of Onset   Healthy Mother    Healthy Father     ALLERGIES:  has no known allergies.  MEDICATIONS:  No current outpatient medications on  file.   No current facility-administered medications for this visit.     PHYSICAL EXAMINATION:   Vitals:   12/25/24 1346  BP: (!) 110/59  Pulse: 78  Resp: 16  Temp: 98 F (36.7 C)  SpO2: 99%   Filed Weights   12/25/24 1346  Weight: 187 lb 3.2 oz (84.9 kg)    Physical Exam Vitals and nursing note reviewed.  HENT:     Head: Normocephalic and atraumatic.     Mouth/Throat:     Pharynx: Oropharynx is clear.  Eyes:     Extraocular Movements: Extraocular movements intact.     Pupils: Pupils are equal, round, and reactive to light.  Cardiovascular:     Rate and Rhythm: Normal rate and regular rhythm.  Pulmonary:     Comments: Decreased breath sounds bilaterally.  Abdominal:     Palpations: Abdomen is soft.  Musculoskeletal:        General: Normal range of motion.     Cervical back: Normal range of motion.  Skin:    General: Skin is warm.  Neurological:     General: No focal deficit present.     Mental Status: She is alert and  oriented to person, place, and time.  Psychiatric:        Behavior: Behavior normal.        Judgment: Judgment normal.      LABORATORY DATA:  I have reviewed the data as listed Lab Results  Component Value Date   WBC 4.6 12/03/2024   HGB 7.4 (L) 12/03/2024   HCT 28.2 (L) 12/03/2024   MCV 67 (L) 12/03/2024   PLT 215 12/03/2024   Recent Labs    09/15/24 1510  NA 140  K 4.8  CL 107*  CO2 22  GLUCOSE 83  BUN 9  CREATININE 0.69  CALCIUM 9.9  PROT 6.7  ALBUMIN 4.1  AST 13  ALT 12  ALKPHOS 73  BILITOT 0.3     No results found.  ASSESSMENT & PLAN:   Symptomatic anemia # Anemia- Hb-symptomatic.  Likely due to iron deficiency - from etiology GI blood loss/menorrhagia.  Hemoglobin 7.4; ferritin is 3 send iron saturation is 3%-#2 DVT 5.  Poor tolerance/lack of improvement on oral iron.  Discussed regarding IV iron infusion/Venofer. Discussed the potential acute infusion reactions with IV iron; which are quite rare.  Patient understands the risk; will proceed with infusions.   #Etiology of iron deficiency: Likely secondary to menstrual cycles; patient also knows that she will need a GI evaluation for screening colonoscopy.  Thank you Dr.Gallaher MD for allowing me to participate in the care of your pleasant patient. Please do not hesitate to contact me with questions or concerns in the interim.  Tube tied-no preg test  # DISPOSITION: # NO labs today;  # weekly venofer x 4 # follow up 3 months MD; 2-3 days prior labs- cbc/cmp; LDH; iron studies; ferritin- retic count; B12; possible venofer- Dr.B    All questions were answered. The patient knows to call the clinic with any problems, questions or concerns.    Cindy JONELLE Joe, MD 12/25/2024 2:36 PM

## 2025-01-01 ENCOUNTER — Inpatient Hospital Stay

## 2025-01-01 VITALS — BP 109/71 | HR 59 | Temp 97.7°F | Resp 18

## 2025-01-01 DIAGNOSIS — D509 Iron deficiency anemia, unspecified: Secondary | ICD-10-CM | POA: Diagnosis not present

## 2025-01-01 DIAGNOSIS — D649 Anemia, unspecified: Secondary | ICD-10-CM

## 2025-01-01 MED ORDER — IRON SUCROSE 20 MG/ML IV SOLN
200.0000 mg | Freq: Once | INTRAVENOUS | Status: AC
  Administered 2025-01-01: 200 mg via INTRAVENOUS
  Filled 2025-01-01: qty 10

## 2025-01-01 NOTE — Patient Instructions (Signed)

## 2025-01-08 ENCOUNTER — Inpatient Hospital Stay

## 2025-01-08 VITALS — BP 123/67 | HR 74 | Temp 97.8°F | Resp 18

## 2025-01-08 DIAGNOSIS — D649 Anemia, unspecified: Secondary | ICD-10-CM

## 2025-01-08 DIAGNOSIS — D509 Iron deficiency anemia, unspecified: Secondary | ICD-10-CM | POA: Diagnosis not present

## 2025-01-08 MED ORDER — IRON SUCROSE 20 MG/ML IV SOLN
200.0000 mg | Freq: Once | INTRAVENOUS | Status: AC
  Administered 2025-01-08: 200 mg via INTRAVENOUS
  Filled 2025-01-08: qty 10

## 2025-01-15 ENCOUNTER — Inpatient Hospital Stay

## 2025-01-15 VITALS — BP 122/68 | HR 66 | Temp 97.5°F | Resp 18

## 2025-01-15 DIAGNOSIS — D649 Anemia, unspecified: Secondary | ICD-10-CM

## 2025-01-15 DIAGNOSIS — D509 Iron deficiency anemia, unspecified: Secondary | ICD-10-CM | POA: Diagnosis not present

## 2025-01-15 MED ORDER — SODIUM CHLORIDE 0.9% FLUSH
10.0000 mL | Freq: Once | INTRAVENOUS | Status: AC | PRN
  Administered 2025-01-15: 10 mL
  Filled 2025-01-15: qty 10

## 2025-01-15 MED ORDER — IRON SUCROSE 20 MG/ML IV SOLN
200.0000 mg | Freq: Once | INTRAVENOUS | Status: AC
  Administered 2025-01-15: 200 mg via INTRAVENOUS
  Filled 2025-01-15: qty 10

## 2025-01-15 NOTE — Progress Notes (Signed)
 Patient here for third venofer  infusion. Pt stated that the morning after her second infusion, she woke up itching from her shins to her ankles and with a blistering rash behind her left knee. She stated she took benadryl at home but the rash lasted about a week while taking the benadryl. Rennie, MD notified. Per Rennie, MD ok to proceed with venofer  today and to educate patient to take tylenol and benadryl at home. Pt educated to take benadryl and tylenol when she gets home from infusion today per MD. Pt verified understanding. Pt tolerated infusion well. Pt stable at discharge.

## 2025-01-22 ENCOUNTER — Inpatient Hospital Stay

## 2025-01-22 VITALS — BP 103/67 | HR 74 | Temp 97.0°F | Resp 18

## 2025-01-22 DIAGNOSIS — D649 Anemia, unspecified: Secondary | ICD-10-CM

## 2025-01-22 DIAGNOSIS — D509 Iron deficiency anemia, unspecified: Secondary | ICD-10-CM | POA: Diagnosis not present

## 2025-01-22 MED ORDER — IRON SUCROSE 20 MG/ML IV SOLN
200.0000 mg | Freq: Once | INTRAVENOUS | Status: AC
  Administered 2025-01-22: 200 mg via INTRAVENOUS
  Filled 2025-01-22: qty 10

## 2025-01-22 NOTE — Patient Instructions (Signed)
 Iron  Sucrose Injection What is this medication? IRON  SUCROSE (EYE ern SOO krose) treats low levels of iron  (iron  deficiency anemia) in people with kidney disease. Iron  is a mineral that plays an important role in making red blood cells, which carry oxygen from your lungs to the rest of your body. This medicine may be used for other purposes; ask your health care provider or pharmacist if you have questions. COMMON BRAND NAME(S): Venofer  What should I tell my care team before I take this medication? They need to know if you have any of these conditions: Anemia not caused by low iron  levels Heart disease High levels of iron  in the blood Kidney disease Liver disease An unusual or allergic reaction to iron , other medications, foods, dyes, or preservatives Pregnant or trying to get pregnant Breastfeeding How should I use this medication? This medication is infused into a vein. It is given by your care team in a hospital or clinic setting. Talk to your care team about the use of this medication in children. While it may be prescribed for children as young as 2 years for selected conditions, precautions do apply. Overdosage: If you think you have taken too much of this medicine contact a poison control center or emergency room at once. NOTE: This medicine is only for you. Do not share this medicine with others. What if I miss a dose? Keep appointments for follow-up doses. It is important not to miss your dose. Call your care team if you are unable to keep an appointment. What may interact with this medication? Do not take this medication with any of the following: Deferoxamine Dimercaprol Other iron  products This medication may also interact with the following: Chloramphenicol Deferasirox This list may not describe all possible interactions. Give your health care provider a list of all the medicines, herbs, non-prescription drugs, or dietary supplements you use. Also tell them if you smoke,  drink alcohol, or use illegal drugs. Some items may interact with your medicine. What should I watch for while using this medication? Your condition will be monitored carefully while you are receiving this medication. Tell your care team if your symptoms do not start to get better or if they get worse. You may need blood work done while you are taking this medication. Sometimes, when medications are infused into veins, a little can leak out of the vein and into the tissue around it. If this medication leaks, it can cause a brown or dark stain on the skin. This is not common. It may be permanent. If you feel pain or swelling during your infusion, tell your care team right away. They can stop the infusion and treat the area. You may need to eat more foods that contain iron . Talk to your care team. Foods that contain iron  include whole grains or cereals, dried fruits, beans, peas, leafy green vegetables, and organ meats (liver, kidney). What side effects may I notice from receiving this medication? Side effects that you should report to your care team as soon as possible: Allergic reactions--skin rash, itching, hives, swelling of the face, lips, tongue, or throat Low blood pressure--dizziness, feeling faint or lightheaded, blurry vision Painful swelling, warmth, or redness of the skin, brown or dark skin color at the infusion site Shortness of breath Side effects that usually do not require medical attention (report these to your care team if they continue or are bothersome): Flushing Headache Joint pain Muscle pain Nausea This list may not describe all possible side effects. Call your  doctor for medical advice about side effects. You may report side effects to FDA at 1-800-FDA-1088. Where should I keep my medication? This medication is given in a hospital or clinic. It will not be stored at home. NOTE: This sheet is a summary. It may not cover all possible information. If you have questions about  this medicine, talk to your doctor, pharmacist, or health care provider.  2025 Elsevier/Gold Standard (2024-10-28 00:00:00)

## 2025-03-04 ENCOUNTER — Ambulatory Visit: Admitting: Allergy & Immunology

## 2025-03-23 ENCOUNTER — Inpatient Hospital Stay

## 2025-03-26 ENCOUNTER — Inpatient Hospital Stay: Admitting: Internal Medicine

## 2025-03-26 ENCOUNTER — Inpatient Hospital Stay
